# Patient Record
Sex: Male | Born: 1955
Health system: Southern US, Community
[De-identification: ages and names within clinical notes are randomized; demographics above are authoritative.]

## PROBLEM LIST (undated history)

## (undated) DIAGNOSIS — N189 Chronic kidney disease, unspecified: Secondary | ICD-10-CM

## (undated) HISTORY — PX: BACK SURGERY: SHX140

---

## 1999-06-07 ENCOUNTER — Emergency Department (HOSPITAL_COMMUNITY): Admission: EM | Admit: 1999-06-07 | Discharge: 1999-06-07 | Payer: Self-pay | Admitting: *Deleted

## 2007-04-28 ENCOUNTER — Ambulatory Visit: Payer: Self-pay | Admitting: Sports Medicine

## 2007-04-28 DIAGNOSIS — M533 Sacrococcygeal disorders, not elsewhere classified: Secondary | ICD-10-CM | POA: Insufficient documentation

## 2007-04-29 ENCOUNTER — Encounter: Payer: Self-pay | Admitting: Sports Medicine

## 2013-04-06 ENCOUNTER — Ambulatory Visit
Admission: RE | Admit: 2013-04-06 | Discharge: 2013-04-06 | Disposition: A | Discharge status: Home / Self Care | Payer: BC Managed Care – PPO | Source: Ambulatory Visit | Attending: Sports Medicine | Admitting: Sports Medicine

## 2013-04-06 ENCOUNTER — Encounter: Payer: Self-pay | Admitting: Sports Medicine

## 2013-04-06 ENCOUNTER — Ambulatory Visit (INDEPENDENT_AMBULATORY_CARE_PROVIDER_SITE_OTHER): Payer: BC Managed Care – PPO | Admitting: Sports Medicine

## 2013-04-06 VITALS — BP 104/72 | Ht 70.0 in | Wt 180.0 lb

## 2013-04-06 DIAGNOSIS — M545 Low back pain, unspecified: Secondary | ICD-10-CM

## 2013-04-06 DIAGNOSIS — M533 Sacrococcygeal disorders, not elsewhere classified: Secondary | ICD-10-CM

## 2013-04-06 NOTE — Assessment & Plan Note (Signed)
WE will start a core exercise series that has different elements  Keep up other exercises that work  Can consider CSI or MM relaxant if patient wishes  Reck 6 to 8 wks

## 2013-04-06 NOTE — Progress Notes (Signed)
Patient ID: Chris Howard, male   DOB: [birth year], 58 y.o.   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

## 2015-09-20 DIAGNOSIS — H903 Sensorineural hearing loss, bilateral: Secondary | ICD-10-CM | POA: Diagnosis not present

## 2015-09-24 DIAGNOSIS — M7701 Medial epicondylitis, right elbow: Secondary | ICD-10-CM | POA: Diagnosis not present

## 2016-04-22 ENCOUNTER — Encounter: Payer: Self-pay | Admitting: Family Medicine

## 2016-04-22 ENCOUNTER — Encounter (INDEPENDENT_AMBULATORY_CARE_PROVIDER_SITE_OTHER): Payer: Self-pay

## 2016-04-22 ENCOUNTER — Ambulatory Visit (INDEPENDENT_AMBULATORY_CARE_PROVIDER_SITE_OTHER): Payer: BLUE CROSS/BLUE SHIELD | Admitting: Family Medicine

## 2016-04-22 DIAGNOSIS — S8990XA Unspecified injury of unspecified lower leg, initial encounter: Secondary | ICD-10-CM

## 2016-04-22 NOTE — Patient Instructions (Signed)
You have a Grade 2 calf strain (partial tear of your medial gastroc). Compression sleeve, ACE wrap, or compression sock (20-56mm Hg pressure) during the day and when up and walking around to help with swelling. Ice or heat, whichever feels better at this point 15 minutes at a time 3-4 times a day. Heel lifts typically help unload the area (or shoes with a natural heel lift). Tylenol 500 mg 1-2 tabs three times a day as needed for pain. Typically avoid ibuprofen, aleve as these increase the risk of bleeding. Start ankle range of motion exercises (up/down and alphabet exercises). Over next 1-2 weeks add the theraband strengthening exercise with yellow band. Cycling with low resistance or swimming for cross training in meantime. I wouldn't run until I see you back in the office in 2-3 weeks and we reevaluate your status.

## 2016-04-24 DIAGNOSIS — S8990XA Unspecified injury of unspecified lower leg, initial encounter: Secondary | ICD-10-CM | POA: Insufficient documentation

## 2016-04-24 NOTE — Assessment & Plan Note (Signed)
consistent with Grade 2 strain.  Compression, icing/heat, elevation.  Heel lifts.  Tylenol if needed.  Start motion exercises - add strengthening with theraband over 1-2 weeks as pain improves.  No running - cycling or swimming ok if minimal pain.  F/u in 2-3 weeks.

## 2016-04-24 NOTE — Progress Notes (Signed)
PCP: No PCP Per Patient  Subjective:   HPI: Patient is a 61 y.o. male here for left calf injury.  Patient reports on 4/5 he was lifting something outside weighing about 300 pounds. He states it felt like someone had thrown and hit him with a baseball in back of left calf. + swelling and bruising. Difficulty sleeping due to pain. Iced that night. Pain remains a 4-5/10 level, sharp and stiff. Worse with uphill walking. Bruising the only skin changes. No numbness.  No past medical history on file.  Current Outpatient Prescriptions on File Prior to Visit  Medication Sig Dispense Refill  . levofloxacin (LEVAQUIN) 500 MG tablet      No current facility-administered medications on file prior to visit.     No past surgical history on file.  No Known Allergies  Social History   Social History  . Marital status: Married    Spouse name: N/A  . Number of children: N/A  . Years of education: N/A   Occupational History  . Not on file.   Social History Main Topics  . Smoking status: Never Smoker  . Smokeless tobacco: Never Used  . Alcohol use Not on file  . Drug use: Unknown  . Sexual activity: Not on file   Other Topics Concern  . Not on file   Social History Narrative  . No narrative on file    No family history on file.  BP 118/70   Pulse (!) 59   Ht 5\' 11"  (1.803 m)   Wt 185 lb (83.9 kg)   BMI 25.80 kg/m   Review of Systems: See HPI above.     Objective:  Physical Exam:  Gen: NAD, comfortable in exam room  Left leg: Bruising, mod swelling lower leg.  No other deformity. TTP medial and mid calf.  No achilles, proximal gastroc tendon, other tenderness. Unable to do calf raise.  Pain on plantarflexion and full dorsiflexion of ankle. FROM knee. NVI distally.  MSK u/s left calf:  Venous structures easily compressible.  Layer of anechoic fluid surrounding medial gastroc.  Some disorganization proximally but no obvious tear visualized.  Achilles and  proximal gastroc tendons intact.   Assessment & Plan:  1. Left calf strain - consistent with Grade 2 strain.  Compression, icing/heat, elevation.  Heel lifts.  Tylenol if needed.  Start motion exercises - add strengthening with theraband over 1-2 weeks as pain improves.  No running - cycling or swimming ok if minimal pain.  F/u in 2-3 weeks.

## 2016-05-13 ENCOUNTER — Ambulatory Visit: Payer: BLUE CROSS/BLUE SHIELD | Admitting: Family Medicine

## 2016-06-04 ENCOUNTER — Encounter: Payer: Self-pay | Admitting: Family Medicine

## 2016-06-04 ENCOUNTER — Ambulatory Visit (INDEPENDENT_AMBULATORY_CARE_PROVIDER_SITE_OTHER): Payer: BLUE CROSS/BLUE SHIELD | Admitting: Family Medicine

## 2016-06-04 DIAGNOSIS — M533 Sacrococcygeal disorders, not elsewhere classified: Secondary | ICD-10-CM

## 2016-06-04 NOTE — Patient Instructions (Signed)
You have sacroiliitis (SI joint inflammation). Continue the stretches - hold for 20-30 seconds, repeat each 3 times. You can do this a couple times a day and as needed. Aleve 2 tabs twice a day with food for pain and inflammation - I would recommend taking for 7-10 days then as needed. Consider diclofenac or prednisone dose pack if not improving. Other considerations: physical therapy, chiropractic care. Let me know how you're doing in 1-2 weeks.

## 2016-06-05 NOTE — Assessment & Plan Note (Signed)
2/2 SI joint dysfunction/sacroiliitis.  He would like to start with aleve twice a day and continue his home exercises and stretches..  He will consider physical therapy, chiropractic care, diclofenac, prednisone depending how he's doing over next 1-2 weeks.

## 2016-06-05 NOTE — Progress Notes (Signed)
PCP: Patient, No Pcp Per  Subjective:   HPI: Patient is a 61 y.o. male here for low back pain.  Patient reports having prior history of SI joint problems that resolved with modification (not wearing belt with running) and home exercises/stretches. Past 10 days pain has recurred only on left side without new injury. Worse with sleeping - waking him up every hour. Doing home exercises and stretches which help when this flares up. Better during the day. No radiation into leg. No numbness or skin changes. Pain level 3/10 at rest, up to 7/10 and sharp at worst. No bowel/bladder dysfunction.  No past medical history on file.  Current Outpatient Prescriptions on File Prior to Visit  Medication Sig Dispense Refill  . levofloxacin (LEVAQUIN) 500 MG tablet      No current facility-administered medications on file prior to visit.     No past surgical history on file.  No Known Allergies  Social History   Social History  . Marital status: Married    Spouse name: N/A  . Number of children: N/A  . Years of education: N/A   Occupational History  . Not on file.   Social History Main Topics  . Smoking status: Never Smoker  . Smokeless tobacco: Never Used  . Alcohol use Not on file  . Drug use: Unknown  . Sexual activity: Not on file   Other Topics Concern  . Not on file   Social History Narrative  . No narrative on file    No family history on file.  BP 125/76   Pulse (!) 55   Ht 5\' 10"  (1.778 m)   Wt 190 lb (86.2 kg)   BMI 27.26 kg/m   Review of Systems: See HPI above.     Objective:  Physical Exam:  Gen: NAD, comfortable in exam room  Back: No gross deformity, scoliosis. TTP left SI joint.  No midline or bony TTP. FROM. Strength LEs 5/5 all muscle groups.   2+ MSRs in patellar and achilles tendons, equal bilaterally. Negative SLRs. Sensation intact to light touch bilaterally. Negative logroll bilateral hips Positive fabers on left with decreased  motion.  Negative right fabers but also stiff here.  Negative piriformis.   Assessment & Plan:  1. Low back pain - 2/2 SI joint dysfunction/sacroiliitis.  He would like to start with aleve twice a day and continue his home exercises and stretches..  He will consider physical therapy, chiropractic care, diclofenac, prednisone depending how he's doing over next 1-2 weeks.

## 2016-06-06 ENCOUNTER — Telehealth: Payer: Self-pay | Admitting: Family Medicine

## 2016-06-06 MED ORDER — DICLOFENAC SODIUM 75 MG PO TBEC: 75.0000 mg | DELAYED_RELEASE_TABLET | Freq: Two times a day (BID) | ORAL | 1 refills | Status: DC

## 2016-06-06 NOTE — Telephone Encounter (Signed)
Ok - not unusual for this to radiate down the leg.  Sent the diclofenac in.  If he's still struggling next week let me know.  Thanks!

## 2016-06-06 NOTE — Telephone Encounter (Signed)
Patient states pain has increased. The pain is now moving from the rear of his hip down his leg. Patient initially stated in [place] that he did not need medication but now would like a Rx of Diclofenac to help with his increase of pain   Requesting medication to be sent to [facility] on [address]

## 2016-06-06 NOTE — Telephone Encounter (Signed)
Spoke to patient to let him know that the prescription had been sent to his pharmacy and he stated that he has already picked it up.

## 2016-06-07 ENCOUNTER — Telehealth: Payer: Self-pay | Admitting: Family Medicine

## 2016-06-07 NOTE — Telephone Encounter (Signed)
Patient states his pain is increasing. He is only able to sleep an hour or so a night due to the pain. States the pain has now spread further down his legs. The pain was only at his hip and now has moved down to his knee.   Patient took diclofenac yesterday, one at 11 am and one at night and states there was no relief. Wants to know of additional medication he can take with the diclofenac  States he used to get pain relief from certain exercises and now those exercises are not working.   Pain has gone from a 3/4 to a 9. Patient seeking immediate help. Informed patient provider would not be in the office until Monday. If seeking immediate care, to go to [facility] or ED.

## 2016-06-10 ENCOUNTER — Encounter: Payer: Self-pay | Admitting: Family Medicine

## 2016-06-10 ENCOUNTER — Ambulatory Visit (HOSPITAL_BASED_OUTPATIENT_CLINIC_OR_DEPARTMENT_OTHER)
Admission: RE | Admit: 2016-06-10 | Discharge: 2016-06-10 | Disposition: A | Discharge status: Home / Self Care | Payer: BLUE CROSS/BLUE SHIELD | Source: Ambulatory Visit | Attending: Family Medicine | Admitting: Family Medicine

## 2016-06-10 ENCOUNTER — Ambulatory Visit (INDEPENDENT_AMBULATORY_CARE_PROVIDER_SITE_OTHER): Payer: BLUE CROSS/BLUE SHIELD | Admitting: Family Medicine

## 2016-06-10 VITALS — BP 119/78 | HR 125 | Ht 70.0 in | Wt 190.0 lb

## 2016-06-10 DIAGNOSIS — M5126 Other intervertebral disc displacement, lumbar region: Secondary | ICD-10-CM | POA: Diagnosis not present

## 2016-06-10 DIAGNOSIS — I700 Atherosclerosis of aorta: Secondary | ICD-10-CM | POA: Insufficient documentation

## 2016-06-10 DIAGNOSIS — M545 Low back pain, unspecified: Secondary | ICD-10-CM

## 2016-06-10 DIAGNOSIS — M79605 Pain in left leg: Secondary | ICD-10-CM

## 2016-06-10 DIAGNOSIS — M2578 Osteophyte, vertebrae: Secondary | ICD-10-CM | POA: Diagnosis not present

## 2016-06-10 MED ORDER — HYDROCODONE-ACETAMINOPHEN 7.5-325 MG PO TABS: 1.0000 | ORAL_TABLET | Freq: Four times a day (QID) | ORAL | 0 refills | Status: DC | PRN

## 2016-06-10 MED FILL — HYDROCODON-APAP 7.5-325: 7.5-325 | 5 days supply | Qty: 20 | Fill #0

## 2016-06-10 NOTE — Telephone Encounter (Signed)
Spoke to patient and gave him appointment date and time for MRI. Order has been faxed.

## 2016-06-10 NOTE — Assessment & Plan Note (Signed)
Patient with progressive pain and now radiation into left leg with decreased patellar reflex.  Not improving with diclofenac.  Discussed prednisone and MRI - he would like to do MRI as next step, hold prednisone for now.  Norco as needed for severe pain.  Will call with results and next steps.

## 2016-06-10 NOTE — Progress Notes (Addendum)
PCP: Patient, No Pcp Per  Subjective:   HPI: Patient is a 61 y.o. male here for low back pain.  5/29: Patient reports having prior history of SI joint problems that resolved with modification (not wearing belt with running) and home exercises/stretches. Past 10 days pain has recurred only on left side without new injury. Worse with sleeping - waking him up every hour. Doing home exercises and stretches which help when this flares up. Better during the day. No radiation into leg. No numbness or skin changes. Pain level 3/10 at rest, up to 7/10 and sharp at worst. No bowel/bladder dysfunction.  6/4: Patient returns with persistent low back pain. Pain is sharp at 6-7/10 level left side of low back. Radiating down left leg now into thigh with cramping and into shin with numbness below the knee. Stretches/exercises not helping. Diclofenac has also not helped him. No bowel/bladder dysfunction.  No past medical history on file.  Current Outpatient Prescriptions on File Prior to Visit  Medication Sig Dispense Refill  . diclofenac (VOLTAREN) 75 MG EC tablet Take 1 tablet (75 mg total) by mouth 2 (two) times daily. 60 tablet 1  . levofloxacin (LEVAQUIN) 500 MG tablet      No current facility-administered medications on file prior to visit.     No past surgical history on file.  No Known Allergies  Social History   Social History  . Marital status: Married    Spouse name: N/A  . Number of children: N/A  . Years of education: N/A   Occupational History  . Not on file.   Social History Main Topics  . Smoking status: Never Smoker  . Smokeless tobacco: Never Used  . Alcohol use Not on file  . Drug use: Unknown  . Sexual activity: Not on file   Other Topics Concern  . Not on file   Social History Narrative  . No narrative on file    No family history on file.  BP 119/78   Pulse (!) 125   Ht 5\' 10"  (1.778 m)   Wt 190 lb (86.2 kg)   BMI 27.26 kg/m   Review of  Systems: See HPI above.     Objective:  Physical Exam:  Gen: NAD, comfortable in exam room  Back: No gross deformity, scoliosis. TTP left lumbar paraspinal region.  No midline or bony TTP. FROM. Strength LEs 5-/5 with hip flexion, knee extension.  5/5 all other muscle groups.   Trace left patellar, 2+ right patellar and bilateral achilles tendon MSRs. Negative SLRs. Sensation intact to light touch bilaterally. Negative logroll bilateral hips   Assessment & Plan:  1. Low back pain - Patient with progressive pain and now radiation into left leg with decreased patellar reflex.  Not improving with diclofenac.  Discussed prednisone and MRI - he would like to do MRI as next step, hold prednisone for now.  Norco as needed for severe pain.  Will call with results and next steps.  Addendum:  MRI reviewed and discussed with patient.  He has a disc extrusion at L3-4 on left side also with a sequestered disc fragment here leading to radiculopathy.  We discussed options - he would like to try prednisone dose pack and call us in a week to let us know how he's doing.  Consider ESI here or physical therapy depending on his improvement.

## 2016-06-10 NOTE — Patient Instructions (Signed)
We will go ahead with an MRI of your lumbar spine asap. This is consistent with either an L4 or L5 radiculopathy based on distribution, decreased reflex. Take norco as needed for severe pain at nighttime and when you return home from work. Consider prednisone dose pack, injection, neurosurgery referral, physical therapy depending on results.

## 2016-06-10 NOTE — Telephone Encounter (Signed)
The concern then is that he's getting referred pain more from a lumbar nerve root than from the SI joint despite his exam.  I think he should try prednisone dose pack, think about imaging of lumbar spine if this doesn't work after a couple days.

## 2016-06-11 MED ORDER — PREDNISONE 10 MG PO TABS: ORAL_TABLET | ORAL | 0 refills | Status: DC

## 2016-06-11 NOTE — Addendum Note (Signed)
Addended by: Karlton Lemon R on: 06/11/2016 05:00 PM   Modules accepted: Orders

## 2016-06-19 ENCOUNTER — Telehealth: Payer: Self-pay | Admitting: Family Medicine

## 2016-06-19 NOTE — Telephone Encounter (Signed)
That's frustrating but a good sign that it helped him initially.  I'd go ahead with an ESI on left side at L3-4 if he's amenable to this.  Other options would be physical therapy, neurosurgery referral - typically want more benefit before starting therapy though.

## 2016-06-19 NOTE — Telephone Encounter (Signed)
Patient has finished prednisone. Calling to update provider on how he is doing. Requesting a call back

## 2016-06-19 NOTE — Telephone Encounter (Signed)
Spoke to patient and he stated that he took the prednisone and felt better first couple of days with 75% of the pain gone. Pain started back on 06-11 and is in the left hip and the left leg. The pain is constant and is having numbness from the thigh to shin. Pain is 7/10.

## 2016-06-21 NOTE — Telephone Encounter (Signed)
Had contacted patient and gave him information provided by the physician.

## 2016-06-27 ENCOUNTER — Ambulatory Visit (INDEPENDENT_AMBULATORY_CARE_PROVIDER_SITE_OTHER): Payer: BLUE CROSS/BLUE SHIELD | Admitting: Sports Medicine

## 2016-06-27 ENCOUNTER — Encounter: Payer: Self-pay | Admitting: Sports Medicine

## 2016-06-27 ENCOUNTER — Ambulatory Visit: Payer: BLUE CROSS/BLUE SHIELD | Admitting: Sports Medicine

## 2016-06-27 DIAGNOSIS — M545 Low back pain, unspecified: Secondary | ICD-10-CM

## 2016-06-27 DIAGNOSIS — M79605 Pain in left leg: Secondary | ICD-10-CM

## 2016-06-27 DIAGNOSIS — M5126 Other intervertebral disc displacement, lumbar region: Secondary | ICD-10-CM | POA: Diagnosis not present

## 2016-06-27 MED ORDER — PREDNISONE 20 MG PO TABS: 20.0000 mg | ORAL_TABLET | Freq: Two times a day (BID) | ORAL | 0 refills | Status: DC

## 2016-06-27 NOTE — Assessment & Plan Note (Signed)
Will restart another 7 days of prednisone at 20 Bid  Refer to neurosurgery for their opinion about intervention

## 2016-06-27 NOTE — Progress Notes (Signed)
   Subjective:    Chris Howard - 61 y.o. male MRN [id number]  Date of birth: 12/07/1955  CC: Low back pain  HPI: Chris Howard is a 61 y/o male presenting with left low back pain. He has a hx of SI joint pain that has been relieved in the past with stretching exercises. He recently saw Dr. Barbaraann Barthel in [facility] and was treated with prednisone which improved his pain at the beginning but his pain returned as the tapered dose wore off. His pain is sharp and localized over the left SI joint. Pain has been present for the past month and it has worsened since then. Pain is worse with activity and wakes him from sleep as well.   Endorses weakness in his left leg and he fell twice due to the weakness. This is a new finding.  Endorses radiation of pain down his left leg. Endorses numbness on the anterior aspect of his left tibia.   ROS: Denies swelling. Denies pain while sneezing or coughing. Denies bowel or bladder dysfxn. Negative except per HPI.  PMH: SI joint pain  Surgical Hx: None Social Hx: Runs about 5 to 7 miles per day but unable to do so for the past month.  MRI L Spine: Shows disc extrusion at L3-L4 on left side also with a sequestered disc fragment leading to radiculopathy.  Objective:   Physical Exam BP 108/70   Ht 5\' 10"  (1.778 m)   Wt 190 lb (86.2 kg)   BMI 27.26 kg/m  Gen: NAD, alert, cooperative with exam, well-appearing Skin: no rashes, normal turgor  MSK: Back inspection unremarkable. TTP over left SI joint. FROM of back upon flexion and extension but painful. FROM in knee and hip. R. Leg strength: Quad: 5/5 Hamstring: 5/5 Hip flexor: 5/5 Hip abductors: 5/5  L. Leg strength: Quad: 3/5 Hamstring: 3/5 Hip flexor: 3/5 Hip abductors: 3/5  SLR laying: Negative  XSLR laying: Negative  Sensory: R leg sensation grossly intact; L leg sensation diminished to light touch over the proximal tibia. Reflexes: Diminished patellar on the left; +2 patellar reflex on the right      Assessment & Plan:  Chris Howard is a 61 y/o male presenting with left low back pain.  L3-L4 radiculopathy His MRI shows disc extrusion at L3-L4. He has significant weakness of his left leg which is worrisome. I would like for him to get evaluated by a spine surgeon for possible microdiscectomy or other possible intervention. Since he has had some relief by using prednisone, I will start him on prednisone 20mg  until he gets evaluated by a spine specialist. -Referral for a spine specialist -20mg  prednisone BID for 7 days

## 2016-06-27 NOTE — Assessment & Plan Note (Signed)
This persists Improved with prednisone  Does not like pain meds and basically using some advil  Likely from the L3/4 disc

## 2016-07-01 ENCOUNTER — Encounter: Payer: Self-pay | Admitting: Family Medicine

## 2016-07-18 ENCOUNTER — Other Ambulatory Visit: Payer: Self-pay | Admitting: *Deleted

## 2016-07-18 NOTE — Progress Notes (Signed)
Dr Sherwood Gambler 07/30/16 at [facility]  [place] [address]

## 2016-07-24 DIAGNOSIS — M5126 Other intervertebral disc displacement, lumbar region: Secondary | ICD-10-CM | POA: Diagnosis not present

## 2016-07-24 DIAGNOSIS — M549 Dorsalgia, unspecified: Secondary | ICD-10-CM | POA: Diagnosis not present

## 2016-07-24 DIAGNOSIS — M5416 Radiculopathy, lumbar region: Secondary | ICD-10-CM | POA: Diagnosis not present

## 2016-07-24 DIAGNOSIS — M545 Low back pain: Secondary | ICD-10-CM | POA: Diagnosis not present

## 2016-07-26 DIAGNOSIS — M5126 Other intervertebral disc displacement, lumbar region: Secondary | ICD-10-CM | POA: Diagnosis not present

## 2016-07-26 DIAGNOSIS — Z4689 Encounter for fitting and adjustment of other specified devices: Secondary | ICD-10-CM | POA: Diagnosis not present

## 2016-07-26 DIAGNOSIS — M545 Low back pain: Secondary | ICD-10-CM | POA: Diagnosis not present

## 2016-07-26 DIAGNOSIS — M5416 Radiculopathy, lumbar region: Secondary | ICD-10-CM | POA: Diagnosis not present

## 2016-07-30 DIAGNOSIS — M5416 Radiculopathy, lumbar region: Secondary | ICD-10-CM | POA: Diagnosis not present

## 2016-07-30 DIAGNOSIS — Z01818 Encounter for other preprocedural examination: Secondary | ICD-10-CM | POA: Diagnosis not present

## 2016-08-05 DIAGNOSIS — M5106 Intervertebral disc disorders with myelopathy, lumbar region: Secondary | ICD-10-CM | POA: Diagnosis not present

## 2016-08-05 DIAGNOSIS — M48061 Spinal stenosis, lumbar region without neurogenic claudication: Secondary | ICD-10-CM | POA: Diagnosis not present

## 2016-08-05 DIAGNOSIS — Z79899 Other long term (current) drug therapy: Secondary | ICD-10-CM | POA: Diagnosis not present

## 2016-08-05 DIAGNOSIS — M5416 Radiculopathy, lumbar region: Secondary | ICD-10-CM | POA: Diagnosis not present

## 2016-08-05 DIAGNOSIS — M5116 Intervertebral disc disorders with radiculopathy, lumbar region: Secondary | ICD-10-CM | POA: Diagnosis not present

## 2016-08-05 DIAGNOSIS — M4326 Fusion of spine, lumbar region: Secondary | ICD-10-CM | POA: Diagnosis not present

## 2016-08-05 DIAGNOSIS — M545 Low back pain: Secondary | ICD-10-CM | POA: Diagnosis not present

## 2016-08-05 DIAGNOSIS — Z87891 Personal history of nicotine dependence: Secondary | ICD-10-CM | POA: Diagnosis not present

## 2016-08-05 DIAGNOSIS — M961 Postlaminectomy syndrome, not elsewhere classified: Secondary | ICD-10-CM | POA: Diagnosis not present

## 2016-08-05 DIAGNOSIS — M5126 Other intervertebral disc displacement, lumbar region: Secondary | ICD-10-CM | POA: Diagnosis not present

## 2016-08-05 DIAGNOSIS — M25552 Pain in left hip: Secondary | ICD-10-CM | POA: Diagnosis not present

## 2017-02-20 DIAGNOSIS — M545 Low back pain: Secondary | ICD-10-CM | POA: Diagnosis not present

## 2017-02-20 DIAGNOSIS — M5136 Other intervertebral disc degeneration, lumbar region: Secondary | ICD-10-CM | POA: Diagnosis not present

## 2018-01-10 DIAGNOSIS — J22 Unspecified acute lower respiratory infection: Secondary | ICD-10-CM | POA: Diagnosis not present

## 2018-01-10 DIAGNOSIS — R05 Cough: Secondary | ICD-10-CM | POA: Diagnosis not present

## 2018-01-10 DIAGNOSIS — R062 Wheezing: Secondary | ICD-10-CM | POA: Diagnosis not present

## 2018-10-14 DIAGNOSIS — L819 Disorder of pigmentation, unspecified: Secondary | ICD-10-CM | POA: Diagnosis not present

## 2018-10-14 DIAGNOSIS — L821 Other seborrheic keratosis: Secondary | ICD-10-CM | POA: Diagnosis not present

## 2019-06-04 DIAGNOSIS — L821 Other seborrheic keratosis: Secondary | ICD-10-CM | POA: Diagnosis not present

## 2019-06-04 DIAGNOSIS — B351 Tinea unguium: Secondary | ICD-10-CM | POA: Diagnosis not present

## 2019-06-04 DIAGNOSIS — B360 Pityriasis versicolor: Secondary | ICD-10-CM | POA: Diagnosis not present

## 2019-06-04 DIAGNOSIS — B353 Tinea pedis: Secondary | ICD-10-CM | POA: Diagnosis not present

## 2019-12-23 DIAGNOSIS — L308 Other specified dermatitis: Secondary | ICD-10-CM | POA: Diagnosis not present

## 2019-12-23 DIAGNOSIS — L562 Photocontact dermatitis [berloque dermatitis]: Secondary | ICD-10-CM | POA: Diagnosis not present

## 2019-12-23 DIAGNOSIS — L986 Other infiltrative disorders of the skin and subcutaneous tissue: Secondary | ICD-10-CM | POA: Diagnosis not present

## 2020-01-11 DIAGNOSIS — Z20828 Contact with and (suspected) exposure to other viral communicable diseases: Secondary | ICD-10-CM | POA: Diagnosis not present

## 2020-02-16 DIAGNOSIS — J301 Allergic rhinitis due to pollen: Secondary | ICD-10-CM | POA: Diagnosis not present

## 2020-02-16 DIAGNOSIS — R21 Rash and other nonspecific skin eruption: Secondary | ICD-10-CM | POA: Diagnosis not present

## 2020-02-16 DIAGNOSIS — J3081 Allergic rhinitis due to animal (cat) (dog) hair and dander: Secondary | ICD-10-CM | POA: Diagnosis not present

## 2020-02-16 DIAGNOSIS — J3089 Other allergic rhinitis: Secondary | ICD-10-CM | POA: Diagnosis not present

## 2020-03-02 DIAGNOSIS — R21 Rash and other nonspecific skin eruption: Secondary | ICD-10-CM | POA: Diagnosis not present

## 2020-03-08 DIAGNOSIS — L309 Dermatitis, unspecified: Secondary | ICD-10-CM | POA: Diagnosis not present

## 2020-03-27 DIAGNOSIS — J301 Allergic rhinitis due to pollen: Secondary | ICD-10-CM | POA: Diagnosis not present

## 2020-03-27 DIAGNOSIS — J3089 Other allergic rhinitis: Secondary | ICD-10-CM | POA: Diagnosis not present

## 2020-03-27 DIAGNOSIS — R21 Rash and other nonspecific skin eruption: Secondary | ICD-10-CM | POA: Diagnosis not present

## 2020-03-27 DIAGNOSIS — J3081 Allergic rhinitis due to animal (cat) (dog) hair and dander: Secondary | ICD-10-CM | POA: Diagnosis not present

## 2020-03-29 DIAGNOSIS — J3089 Other allergic rhinitis: Secondary | ICD-10-CM | POA: Diagnosis not present

## 2020-03-29 DIAGNOSIS — R21 Rash and other nonspecific skin eruption: Secondary | ICD-10-CM | POA: Diagnosis not present

## 2020-03-29 DIAGNOSIS — J301 Allergic rhinitis due to pollen: Secondary | ICD-10-CM | POA: Diagnosis not present

## 2020-03-29 DIAGNOSIS — L230 Allergic contact dermatitis due to metals: Secondary | ICD-10-CM | POA: Diagnosis not present

## 2020-03-31 DIAGNOSIS — J3081 Allergic rhinitis due to animal (cat) (dog) hair and dander: Secondary | ICD-10-CM | POA: Diagnosis not present

## 2020-03-31 DIAGNOSIS — J3089 Other allergic rhinitis: Secondary | ICD-10-CM | POA: Diagnosis not present

## 2020-03-31 DIAGNOSIS — J301 Allergic rhinitis due to pollen: Secondary | ICD-10-CM | POA: Diagnosis not present

## 2020-03-31 DIAGNOSIS — R21 Rash and other nonspecific skin eruption: Secondary | ICD-10-CM | POA: Diagnosis not present

## 2020-04-11 DIAGNOSIS — L821 Other seborrheic keratosis: Secondary | ICD-10-CM | POA: Diagnosis not present

## 2020-04-11 DIAGNOSIS — L309 Dermatitis, unspecified: Secondary | ICD-10-CM | POA: Diagnosis not present

## 2020-05-24 DIAGNOSIS — Z23 Encounter for immunization: Secondary | ICD-10-CM | POA: Diagnosis not present

## 2020-05-24 DIAGNOSIS — Z13220 Encounter for screening for lipoid disorders: Secondary | ICD-10-CM | POA: Diagnosis not present

## 2020-05-24 DIAGNOSIS — Z0000 Encounter for general adult medical examination without abnormal findings: Secondary | ICD-10-CM | POA: Diagnosis not present

## 2020-06-27 DIAGNOSIS — R079 Chest pain, unspecified: Secondary | ICD-10-CM | POA: Diagnosis not present

## 2020-08-23 DIAGNOSIS — R109 Unspecified abdominal pain: Secondary | ICD-10-CM | POA: Diagnosis not present

## 2020-08-23 DIAGNOSIS — R079 Chest pain, unspecified: Secondary | ICD-10-CM | POA: Diagnosis not present

## 2020-08-24 ENCOUNTER — Ambulatory Visit
Admission: RE | Admit: 2020-08-24 | Discharge: 2020-08-24 | Disposition: A | Discharge status: Home / Self Care | Payer: BLUE CROSS/BLUE SHIELD | Source: Ambulatory Visit | Attending: Family Medicine | Admitting: Family Medicine

## 2020-08-24 ENCOUNTER — Other Ambulatory Visit: Payer: Self-pay | Admitting: Family Medicine

## 2020-08-24 ENCOUNTER — Ambulatory Visit
Admission: RE | Admit: 2020-08-24 | Discharge: 2020-08-24 | Disposition: A | Discharge status: Home / Self Care | Payer: BC Managed Care – PPO | Source: Ambulatory Visit | Attending: Family Medicine | Admitting: Family Medicine

## 2020-08-24 DIAGNOSIS — R7989 Other specified abnormal findings of blood chemistry: Secondary | ICD-10-CM

## 2020-08-24 DIAGNOSIS — R079 Chest pain, unspecified: Secondary | ICD-10-CM

## 2020-08-24 DIAGNOSIS — R071 Chest pain on breathing: Secondary | ICD-10-CM

## 2020-08-24 MED ORDER — IOPAMIDOL (ISOVUE-370) INJECTION 76%
75.0000 mL | Freq: Once | INTRAVENOUS | Status: AC | PRN
  Administered 2020-08-24: 75 mL via INTRAVENOUS

## 2020-08-25 ENCOUNTER — Telehealth: Payer: Self-pay | Admitting: Hematology and Oncology

## 2020-08-25 NOTE — Telephone Encounter (Signed)
Received a new hem referral from Chris Howard for hx of dvt. Mr. Chris Howard has been cld and scheduled to see Dr. Lindi Adie on 9/6 at 3pm. Pt aware to arrive 15 minutes early.

## 2020-08-29 ENCOUNTER — Other Ambulatory Visit: Payer: Self-pay | Admitting: Family Medicine

## 2020-08-29 DIAGNOSIS — R93429 Abnormal radiologic findings on diagnostic imaging of unspecified kidney: Secondary | ICD-10-CM

## 2020-09-10 NOTE — Progress Notes (Signed)
[facility] CONSULT NOTE  Patient Care Team: Pa, [place] as PCP - General (Family Medicine)  CHIEF COMPLAINTS/PURPOSE OF CONSULTATION:  Chronic pulmonary embolus  HISTORY OF PRESENTING ILLNESS:  Chris Howard 65 y.o. male is here because of recent history of pulmonary embolism.  Patient tells me that over the past 6 months or so he has had intermittent chest pains which were initially treated as inflammation of the cartilage with supportive care until the chest pain intermittently got worse.  He was given muscle relaxant which appeared to help with the chest pain.  But more recently started having right flank pain and this led to a CT of the chest showing extensive thrombosis of the pulmonary arteries in the segmental and subsegmental areas along with hilar lymphadenopathy and a soft tissue attenuation of unclear origin.  He was started on Xarelto and appears to be tolerating it well.  The CT also identified a renal mass with a cystic lesion for which he is undergoing abdominal MRI  US Venous Img Lower Bilateral on 08/24/2020 showed no femoropopliteal DVT nor evidence of DVT within the visualized calf veins. He presents to the clinic today for initial evaluation and discussion of treatment options.   I reviewed his records extensively and collaborated the history with the patient.  MEDICAL HISTORY:  Vitiligo Skin rash  SURGICAL HISTORY: No prior surgeries  SOCIAL HISTORY: Denies any tobacco or alcohol or recreational drug use FAMILY HISTORY: Extensive family history of blood clots   ALLERGIES:  has No Known Allergies.  MEDICATIONS:  Current Outpatient Medications  Medication Sig Dispense Refill   rivaroxaban (XARELTO) 20 MG TABS tablet Take 1 tablet (20 mg total) by mouth daily with supper. 30 tablet 11   diclofenac (VOLTAREN) 75 MG EC tablet Take 1 tablet (75 mg total) by mouth 2 (two) times daily. 60 tablet 1   HYDROcodone-acetaminophen (NORCO)  7.5-325 MG tablet Take 1 tablet by mouth every 6 (six) hours as needed for moderate pain. 20 tablet 0   levofloxacin (LEVAQUIN) 500 MG tablet      predniSONE (DELTASONE) 10 MG tablet 6 tabs po day 1, 5 tabs po day 2, 4 tabs po day 3, 3 tabs po day 4, 2 tabs po day 5, 1 tab po day 6 21 tablet 0   predniSONE (DELTASONE) 20 MG tablet Take 1 tablet (20 mg total) by mouth 2 (two) times daily. 14 tablet 0   No current facility-administered medications for this visit.    REVIEW OF SYSTEMS:   Constitutional: Denies fevers, chills or abnormal night sweats Eyes: Denies blurriness of vision, double vision or watery eyes Ears, nose, mouth, throat, and face: Denies mucositis or sore throat Respiratory: Denies cough, dyspnea or wheezes Cardiovascular: Denies palpitation, chest discomfort or lower extremity swelling Gastrointestinal:  Denies nausea, heartburn or change in bowel habits Skin: Denies abnormal skin rashes Lymphatics: Denies new lymphadenopathy or easy bruising Neurological:Denies numbness, tingling or new weaknesses Behavioral/Psych: Mood is stable, no new changes  All other systems were reviewed with the patient and are negative.  PHYSICAL EXAMINATION: ECOG PERFORMANCE STATUS: 1 - Symptomatic but completely ambulatory  Vitals:   09/12/20 1521 09/12/20 1523  BP: (!) 97/50 121/82  Pulse: 60   Resp: 18   Temp: (!) 97.3 F (36.3 C)   SpO2: 98%    Filed Weights   09/12/20 1521  Weight: 190 lb 4.8 oz (86.3 kg)   RADIOGRAPHIC STUDIES: I have personally reviewed the radiological reports and  agreed with the findings in the report.  ASSESSMENT AND PLAN:  Chronic pulmonary embolism (HCC) CT angiogram 08/25/2020: Chronic right pulmonary artery PE with recanalization, no heart strain possible right lung infarct right upper lobe branches, unusual appearance of soft tissue attenuation in the inferior right hilum (felt to be segmental and subsegmental PE with recannulization), right hilar  lymph node 2 cm enhancement behind the 3.4 cm cystic lesion  Plan: 1.  Given his extensive family history, recommend doing hypercoagulability work-up today 2. continue anticoagulation with Xarelto. If no clear-cut etiology is identified then I suspect that he will remain on Xarelto for at least a year. We may consider obtaining a CT chest in 6 months to assess response to Xarelto.  Soft tissue attenuation: Unclear etiology.  We may repeat the CT in 6 months to assess that.  Right kidney lesion: MRI of the abdomen is being performed.  I will send a referral to urology.  I will call him in 1 to 2 weeks with results of hypercoagulability work-up.   All questions were answered. The patient knows to call the clinic with any problems, questions or concerns.   Rulon Eisenmenger, MD, MPH 09/12/2020    I, Thana Ates, am acting as scribe for Nicholas Lose, MD.  I have reviewed the above documentation for accuracy and completeness, and I agree with the above.

## 2020-09-12 ENCOUNTER — Inpatient Hospital Stay: Payer: BC Managed Care – PPO | Attending: Hematology and Oncology | Admitting: Hematology and Oncology

## 2020-09-12 ENCOUNTER — Inpatient Hospital Stay: Payer: BC Managed Care – PPO

## 2020-09-12 ENCOUNTER — Other Ambulatory Visit: Payer: Self-pay

## 2020-09-12 DIAGNOSIS — I2782 Chronic pulmonary embolism: Secondary | ICD-10-CM

## 2020-09-12 DIAGNOSIS — R590 Localized enlarged lymph nodes: Secondary | ICD-10-CM | POA: Diagnosis not present

## 2020-09-12 DIAGNOSIS — Z7901 Long term (current) use of anticoagulants: Secondary | ICD-10-CM | POA: Insufficient documentation

## 2020-09-12 DIAGNOSIS — Z79899 Other long term (current) drug therapy: Secondary | ICD-10-CM | POA: Diagnosis not present

## 2020-09-12 DIAGNOSIS — R079 Chest pain, unspecified: Secondary | ICD-10-CM | POA: Diagnosis not present

## 2020-09-12 DIAGNOSIS — R109 Unspecified abdominal pain: Secondary | ICD-10-CM | POA: Insufficient documentation

## 2020-09-12 DIAGNOSIS — N2889 Other specified disorders of kidney and ureter: Secondary | ICD-10-CM | POA: Insufficient documentation

## 2020-09-12 MED ORDER — RIVAROXABAN 20 MG PO TABS: 20.0000 mg | ORAL_TABLET | Freq: Every day | ORAL | 11 refills | Status: DC

## 2020-09-12 NOTE — Assessment & Plan Note (Addendum)
CT angiogram 08/25/2020: Chronic right pulmonary artery PE with recanalization, no heart strain possible right lung infarct right upper lobe branches, unusual appearance of soft tissue attenuation in the inferior right hilum (felt to be segmental and subsegmental PE with recannulization), right hilar lymph node 2 cm enhancement behind the 3.4 cm cystic lesion  Plan: 1.  Given his extensive family history, recommend doing hypercoagulability work-up today 2. continue anticoagulation with Xarelto. If no clear-cut etiology is identified then I suspect that he will remain on Xarelto for at least a year. We may consider obtaining a CT chest in 6 months to assess response to Xarelto.  Soft tissue attenuation: Unclear etiology.  We may repeat the CT in 6 months to assess that.  Right kidney lesion: MRI of the abdomen is being performed.  I will send a referral to urology.  I will call him in 1 to 2 weeks with results of hypercoagulability work-up.

## 2020-09-13 LAB — PROTEIN S, TOTAL: Protein S Ag, Total: 95 % (ref 60–150)

## 2020-09-13 LAB — DRVVT CONFIRM: dRVVT Confirm: 1.3 ratio — ABNORMAL HIGH (ref 0.8–1.2)

## 2020-09-13 LAB — BETA 2 MICROGLOBULIN, SERUM: Beta-2 Microglobulin: 1.4 mg/L (ref 0.6–2.4)

## 2020-09-13 LAB — ANTITHROMBIN III ANTIGEN: AT III AG PPP IMM-ACNC: 105 % (ref 72–124)

## 2020-09-13 LAB — DRVVT MIX: dRVVT Mix: 48 s — ABNORMAL HIGH (ref 0.0–40.4)

## 2020-09-13 LAB — LUPUS ANTICOAGULANT PANEL
DRVVT: 55.7 s — ABNORMAL HIGH (ref 0.0–47.0)
PTT Lupus Anticoagulant: 31 s (ref 0.0–51.9)

## 2020-09-13 LAB — PROTEIN C, TOTAL: Protein C, Total: 114 % (ref 60–150)

## 2020-09-14 LAB — CARDIOLIPIN ANTIBODIES, IGG, IGM, IGA
Anticardiolipin IgA: <9 APL U/mL (ref 0–11)
Anticardiolipin IgG: <9 GPL U/mL (ref 0–14)
Anticardiolipin IgM: 9 MPL U/mL (ref 0–12)

## 2020-09-18 LAB — PROTHROMBIN GENE MUTATION

## 2020-09-18 LAB — FACTOR 5 LEIDEN

## 2020-09-18 NOTE — Progress Notes (Signed)
  HEMATOLOGY-ONCOLOGY TELEPHONE VISIT PROGRESS NOTE  I connected with Chris Howard on 09/19/2020 at 10:45 AM EDT by telephone and verified that I am speaking with the correct person using two identifiers.  I discussed the limitations, risks, security and privacy concerns of performing an evaluation and management service by telephone and the availability of in person appointments.  I also discussed with the patient that there may be a patient responsible charge related to this service. The patient expressed understanding and agreed to proceed.   History of Present Illness: Chris Howard is a 65 y.o. male with above-mentioned history of chronic pulmonary embolism. He presents via telephone today for follow-up.     Assessment Plan:  Chronic pulmonary embolism ([place]) CT angiogram 08/25/2020: Chronic right pulmonary artery PE with recanalization, no heart strain possible right lung infarct right upper lobe branches, unusual appearance of soft tissue attenuation in the inferior right hilum (felt to be segmental and subsegmental PE with recannulization), right hilar lymph node 2 cm enhancement behind the 3.4 cm cystic lesion  Lab review: 09/12/2020: Lupus anticoagulant positive Factor V Leiden and prothrombin gene mutations: Negative Protein S: 95%, protein C: 114%, anticardiolipin antibodies: Negative, Antithrombin III: 105%  Current treatment: Xarelto He is planning to get Xarelto from [place].  For the next month we will try to send a prescription to [place] so that he may be able to use a voucher to get the first month free.    I discussed the assessment and treatment plan with the patient. The patient was provided an opportunity to ask questions and all were answered. The patient agreed with the plan and demonstrated an understanding of the instructions. The patient was advised to call back or seek an in-person evaluation if the symptoms worsen or if the condition fails to improve as  anticipated.   Total time spent: 12 mins including non-face to face time and time spent for planning, charting and coordination of care  Rulon Eisenmenger, MD 09/19/2020    I, Thana Ates, am acting as scribe for Nicholas Lose, MD.  I have reviewed the above documentation for accuracy and completeness, and I agree with the above.

## 2020-09-19 ENCOUNTER — Telehealth: Payer: Self-pay | Admitting: *Deleted

## 2020-09-19 ENCOUNTER — Other Ambulatory Visit (HOSPITAL_COMMUNITY): Payer: Self-pay

## 2020-09-19 ENCOUNTER — Inpatient Hospital Stay (HOSPITAL_BASED_OUTPATIENT_CLINIC_OR_DEPARTMENT_OTHER): Payer: BC Managed Care – PPO | Admitting: Hematology and Oncology

## 2020-09-19 DIAGNOSIS — I2782 Chronic pulmonary embolism: Secondary | ICD-10-CM | POA: Diagnosis not present

## 2020-09-19 MED ORDER — RIVAROXABAN 20 MG PO TABS
20.0000 mg | ORAL_TABLET | Freq: Every day | ORAL | 0 refills | Status: DC
  Filled 2020-09-19: qty 30, 30d supply, fill #0

## 2020-09-19 NOTE — Assessment & Plan Note (Signed)
CT angiogram 08/25/2020: Chronic right pulmonary artery PE with recanalization, no heart strain possible right lung infarct right upper lobe branches, unusual appearance of soft tissue attenuation in the inferior right hilum (felt to be segmental and subsegmental PE with recannulization), right hilar lymph node 2 cm enhancement behind the 3.4 cm cystic lesion  Lab review: 09/12/2020: Lupus anticoagulant positive Factor V Leiden and prothrombin gene mutations: Negative Protein S: 95%, protein C: 114%, anticardiolipin antibodies: Negative, Antithrombin III: 105%  I explained to the patient in detail the significance of lupus anticoagulant testing. We will repeat the same test again in 3 months to confirm the diagnosis.  Patient understands that this would mean that he will need to be on anticoagulation for life.

## 2020-09-19 NOTE — Telephone Encounter (Signed)
Per MD request RN attempt x1 to contact pt regarding Xarelto script being sent to [facility] out pt pharmacy to help pt facilitate savings card enrollment.  No answer.  LVM for pt to return call to the office.

## 2020-09-21 ENCOUNTER — Other Ambulatory Visit: Payer: Self-pay

## 2020-09-21 ENCOUNTER — Ambulatory Visit
Admission: RE | Admit: 2020-09-21 | Discharge: 2020-09-21 | Disposition: A | Discharge status: Home / Self Care | Payer: BC Managed Care – PPO | Source: Ambulatory Visit | Attending: Family Medicine | Admitting: Family Medicine

## 2020-09-21 DIAGNOSIS — N281 Cyst of kidney, acquired: Secondary | ICD-10-CM | POA: Diagnosis not present

## 2020-09-21 DIAGNOSIS — R93429 Abnormal radiologic findings on diagnostic imaging of unspecified kidney: Secondary | ICD-10-CM

## 2020-09-21 MED ORDER — GADOBENATE DIMEGLUMINE 529 MG/ML IV SOLN
18.0000 mL | Freq: Once | INTRAVENOUS | Status: AC | PRN
  Administered 2020-09-21: 18 mL via INTRAVENOUS

## 2020-09-25 ENCOUNTER — Other Ambulatory Visit (HOSPITAL_COMMUNITY): Payer: Self-pay

## 2020-10-03 DIAGNOSIS — N281 Cyst of kidney, acquired: Secondary | ICD-10-CM | POA: Diagnosis not present

## 2020-10-03 DIAGNOSIS — C641 Malignant neoplasm of right kidney, except renal pelvis: Secondary | ICD-10-CM | POA: Diagnosis not present

## 2020-10-30 ENCOUNTER — Other Ambulatory Visit: Payer: Self-pay | Admitting: Hematology and Oncology

## 2020-10-30 ENCOUNTER — Other Ambulatory Visit (HOSPITAL_COMMUNITY): Payer: Self-pay

## 2020-10-30 DIAGNOSIS — I2782 Chronic pulmonary embolism: Secondary | ICD-10-CM

## 2020-10-30 MED ORDER — RIVAROXABAN 20 MG PO TABS
20.0000 mg | ORAL_TABLET | Freq: Every day | ORAL | 12 refills | Status: DC
  Filled 2020-10-30: qty 30, 30d supply, fill #0
  Filled 2020-12-08: qty 30, 30d supply, fill #1
  Filled 2020-12-27: qty 30, 30d supply, fill #2
  Filled 2021-02-23: qty 30, 30d supply, fill #3
  Filled 2021-03-26: qty 30, 30d supply, fill #4

## 2020-12-07 DIAGNOSIS — C641 Malignant neoplasm of right kidney, except renal pelvis: Secondary | ICD-10-CM | POA: Diagnosis not present

## 2020-12-07 DIAGNOSIS — Z125 Encounter for screening for malignant neoplasm of prostate: Secondary | ICD-10-CM | POA: Diagnosis not present

## 2020-12-08 ENCOUNTER — Other Ambulatory Visit (HOSPITAL_COMMUNITY): Payer: Self-pay

## 2020-12-14 ENCOUNTER — Telehealth: Payer: Self-pay | Admitting: *Deleted

## 2020-12-14 DIAGNOSIS — C641 Malignant neoplasm of right kidney, except renal pelvis: Secondary | ICD-10-CM | POA: Diagnosis not present

## 2020-12-14 DIAGNOSIS — N281 Cyst of kidney, acquired: Secondary | ICD-10-CM | POA: Diagnosis not present

## 2020-12-14 NOTE — Telephone Encounter (Signed)
Received call from pt stating recent scan with Dr. Phebe Colla at [facility] showed a lump on his kidney and is needing to be scheduled for surgery.  Pt requesting for scan and office note be reviewed by MD for recommendations on Xarelto with surgery.  RN placed call to [facility] and LVM for Dr. Zettie Pho nurse to fax recent office note and imaging results for MD to review.

## 2020-12-18 ENCOUNTER — Other Ambulatory Visit: Payer: Self-pay

## 2020-12-18 ENCOUNTER — Inpatient Hospital Stay: Payer: BC Managed Care – PPO | Attending: Hematology and Oncology

## 2020-12-18 ENCOUNTER — Other Ambulatory Visit: Payer: Self-pay | Admitting: Hematology and Oncology

## 2020-12-18 DIAGNOSIS — D6851 Activated protein C resistance: Secondary | ICD-10-CM | POA: Insufficient documentation

## 2020-12-18 DIAGNOSIS — Z7901 Long term (current) use of anticoagulants: Secondary | ICD-10-CM | POA: Diagnosis not present

## 2020-12-18 DIAGNOSIS — I2782 Chronic pulmonary embolism: Secondary | ICD-10-CM | POA: Diagnosis not present

## 2020-12-18 DIAGNOSIS — Z79899 Other long term (current) drug therapy: Secondary | ICD-10-CM | POA: Diagnosis not present

## 2020-12-18 DIAGNOSIS — D6862 Lupus anticoagulant syndrome: Secondary | ICD-10-CM | POA: Diagnosis not present

## 2020-12-20 LAB — DRVVT CONFIRM: dRVVT Confirm: 1.5 ratio — ABNORMAL HIGH (ref 0.8–1.2)

## 2020-12-20 LAB — LUPUS ANTICOAGULANT PANEL
DRVVT: 56.6 s — ABNORMAL HIGH (ref 0.0–47.0)
PTT Lupus Anticoagulant: 30.8 s (ref 0.0–51.9)

## 2020-12-20 LAB — DRVVT MIX: dRVVT Mix: 47 s — ABNORMAL HIGH (ref 0.0–40.4)

## 2020-12-20 NOTE — Progress Notes (Signed)
°  HEMATOLOGY-ONCOLOGY TELEPHONE VISIT PROGRESS NOTE  I connected with Chris Howard on 12/21/2020 at 11:30 AM EST by telephone and verified that I am speaking with the correct person using two identifiers.  I discussed the limitations, risks, security and privacy concerns of performing an evaluation and management service by telephone and the availability of in person appointments.  I also discussed with the patient that there may be a patient responsible charge related to this service. The patient expressed understanding and agreed to proceed.   History of Present Illness: Chris Howard is a 65 y.o. male with above-mentioned history of chronic pulmonary embolism. He presents via telephone today for follow-up.   Observations/Objective:     Assessment Plan:  Chronic pulmonary embolism ([place]) CT angiogram 08/25/2020: Chronic right pulmonary artery PE with recanalization, no heart strain possible right lung infarct right upper lobe branches, unusual appearance of soft tissue attenuation in the inferior right hilum (felt to be segmental and subsegmental PE with recannulization), right hilar lymph node 2 cm enhancement behind the 3.4 cm cystic lesion   Lab review: 09/12/2020: Lupus anticoagulant positive (repeat testing 12/18/2020: Persistent positivity for lupus anticoagulant) Factor V Leiden and prothrombin gene mutations: Negative Protein S: 95%, protein C: 114%, anticardiolipin antibodies: Negative, Antithrombin III: 105%   Current treatment: Xarelto Tolerating Xarelto extremely well.  Recommendation is lifelong anticoagulation  CT chest abdomen pelvis 12/08/2020: [facility]: No PE, improvement in the right lower lobe infarct, right kidney complex cystic lesion 4.6 x 3.1 cm suspicious for cystic renal cell cancer.  Plan is to undergo partial nephrectomy next year.  Perioperative anticoagulation: 48 hours before surgery: Stop Xarelto Lovenox injections 1 mg/kg subcu twice a day for 48 hours No  anticoagulation on day of surgery Resume Xarelto day after surgery  Return to clinic in 1 year for follow-up   I discussed the assessment and treatment plan with the patient. The patient was provided an opportunity to ask questions and all were answered. The patient agreed with the plan and demonstrated an understanding of the instructions. The patient was advised to call back or seek an in-person evaluation if the symptoms worsen or if the condition fails to improve as anticipated.   Total time spent: 15 mins including non-face to face time and time spent for planning, charting and coordination of care  Rulon Eisenmenger, MD 12/21/2020    I, Thana Ates, am acting as scribe for Nicholas Lose, MD.  I have reviewed the above documentation for accuracy and completeness, and I agree with the above.

## 2020-12-21 ENCOUNTER — Inpatient Hospital Stay (HOSPITAL_BASED_OUTPATIENT_CLINIC_OR_DEPARTMENT_OTHER): Payer: BC Managed Care – PPO | Admitting: Hematology and Oncology

## 2020-12-21 DIAGNOSIS — I2782 Chronic pulmonary embolism: Secondary | ICD-10-CM | POA: Diagnosis not present

## 2020-12-21 NOTE — Assessment & Plan Note (Signed)
CT angiogram 08/25/2020: Chronic right pulmonary artery PE with recanalization, no heart strain possible right lung infarctright upper lobe branches, unusual appearance of soft tissue attenuation in the inferior right hilum (felt to be segmental and subsegmental PE with recannulization), right hilar lymph node 2 cm enhancement behind the 3.4 cm cystic lesion  Lab review: 09/12/2020: Lupus anticoagulant positive (repeat testing 12/18/2020: Persistent positivity for lupus anticoagulant) Factor V Leiden and prothrombin gene mutations: Negative Protein S: 95%, protein C: 114%, anticardiolipin antibodies: Negative, Antithrombin III: 105%  Current treatment: Xarelto He is planning to get Xarelto from [place].     I discussed with the patient that his lab results are consistent for antiphospholipid antibody syndrome and that he needs to remain on anticoagulation for life.

## 2020-12-27 ENCOUNTER — Other Ambulatory Visit (HOSPITAL_COMMUNITY): Payer: Self-pay

## 2020-12-28 ENCOUNTER — Other Ambulatory Visit: Payer: Self-pay | Admitting: Urology

## 2020-12-29 MED ORDER — POLYETHYLENE GLYCOL 3350 17 GM/SCOOP PO POWD: 1.0000 | Freq: Once | ORAL | Status: DC

## 2021-01-02 ENCOUNTER — Other Ambulatory Visit (HOSPITAL_COMMUNITY): Payer: Self-pay

## 2021-01-11 ENCOUNTER — Encounter: Payer: Self-pay | Admitting: Hematology and Oncology

## 2021-01-12 ENCOUNTER — Other Ambulatory Visit: Payer: Self-pay | Admitting: Hematology and Oncology

## 2021-01-12 ENCOUNTER — Telehealth: Payer: Self-pay

## 2021-01-12 ENCOUNTER — Ambulatory Visit (HOSPITAL_BASED_OUTPATIENT_CLINIC_OR_DEPARTMENT_OTHER): Payer: BC Managed Care – PPO

## 2021-01-12 DIAGNOSIS — I2782 Chronic pulmonary embolism: Secondary | ICD-10-CM

## 2021-01-12 DIAGNOSIS — M542 Cervicalgia: Secondary | ICD-10-CM

## 2021-01-12 NOTE — Telephone Encounter (Signed)
Called pt to inform him of MD wanting CT scan of neck, this is scheduled for 1/6 @ 2:15.  Pt verbalized understanding.

## 2021-01-12 NOTE — Progress Notes (Signed)
Complaining of sudden onset of neck pain for the past 3 to 4 days.  It is very intense and feels like somebody hit the neck with a baseball bat.  He does not remember injuring or doing anything exertional.  He has a probable history of renal cell cancer and pulmonary embolism. We would like to obtain a CT of the neck with contrast for further evaluation.

## 2021-01-15 ENCOUNTER — Other Ambulatory Visit: Payer: Self-pay

## 2021-01-15 ENCOUNTER — Ambulatory Visit (HOSPITAL_BASED_OUTPATIENT_CLINIC_OR_DEPARTMENT_OTHER)
Admission: RE | Admit: 2021-01-15 | Discharge: 2021-01-15 | Disposition: A | Discharge status: Home / Self Care | Payer: BC Managed Care – PPO | Source: Ambulatory Visit | Attending: Hematology and Oncology | Admitting: Hematology and Oncology

## 2021-01-15 ENCOUNTER — Encounter (HOSPITAL_BASED_OUTPATIENT_CLINIC_OR_DEPARTMENT_OTHER): Payer: Self-pay

## 2021-01-15 DIAGNOSIS — I2782 Chronic pulmonary embolism: Secondary | ICD-10-CM | POA: Insufficient documentation

## 2021-01-15 DIAGNOSIS — K118 Other diseases of salivary glands: Secondary | ICD-10-CM | POA: Diagnosis not present

## 2021-01-15 DIAGNOSIS — M542 Cervicalgia: Secondary | ICD-10-CM | POA: Insufficient documentation

## 2021-01-15 LAB — POCT I-STAT CREATININE: Creatinine, Ser: 0.8 mg/dL (ref 0.61–1.24)

## 2021-01-15 MED ORDER — IOHEXOL 300 MG/ML  SOLN
75.0000 mL | Freq: Once | INTRAMUSCULAR | Status: AC | PRN
  Administered 2021-01-15: 75 mL via INTRAVENOUS

## 2021-01-16 ENCOUNTER — Telehealth: Payer: Self-pay | Admitting: Hematology and Oncology

## 2021-01-16 ENCOUNTER — Other Ambulatory Visit: Payer: Self-pay

## 2021-01-16 DIAGNOSIS — M545 Low back pain, unspecified: Secondary | ICD-10-CM

## 2021-01-16 NOTE — Telephone Encounter (Signed)
Discussed the scan results Pain still ongoing Will refer to orthopedics.

## 2021-01-22 DIAGNOSIS — M47892 Other spondylosis, cervical region: Secondary | ICD-10-CM | POA: Diagnosis not present

## 2021-01-22 DIAGNOSIS — Z6827 Body mass index (BMI) 27.0-27.9, adult: Secondary | ICD-10-CM | POA: Diagnosis not present

## 2021-01-22 DIAGNOSIS — M542 Cervicalgia: Secondary | ICD-10-CM | POA: Diagnosis not present

## 2021-01-23 DIAGNOSIS — N281 Cyst of kidney, acquired: Secondary | ICD-10-CM | POA: Diagnosis not present

## 2021-01-23 DIAGNOSIS — C641 Malignant neoplasm of right kidney, except renal pelvis: Secondary | ICD-10-CM | POA: Diagnosis not present

## 2021-01-24 ENCOUNTER — Other Ambulatory Visit: Payer: Self-pay | Admitting: *Deleted

## 2021-01-24 ENCOUNTER — Other Ambulatory Visit (HOSPITAL_COMMUNITY): Payer: Self-pay

## 2021-01-24 ENCOUNTER — Encounter: Payer: Self-pay | Admitting: *Deleted

## 2021-01-24 MED ORDER — ENOXAPARIN SODIUM 80 MG/0.8ML IJ SOSY
80.0000 mg | PREFILLED_SYRINGE | Freq: Two times a day (BID) | INTRAMUSCULAR | 0 refills | Status: DC
  Filled 2021-01-24: qty 3.2, 2d supply, fill #0

## 2021-01-24 NOTE — Progress Notes (Signed)
RN successfully faxed medical clearance form to [facility] with recommendations regarding Xarelto-Lovenox bridge prior to surgery.

## 2021-01-24 NOTE — Progress Notes (Signed)
Pt currently scheduled to undergo Right Robotic Partial Nephrectomy on 02/07/21.  Per MD pt needing to stop Xarelto 2 days prior to surgery and resume the day after surgery.  MD also suggest pt bridge with Lovenox two days prior to surgery.  Verbal orders received for pt to self administer Lovenox 80 mg subcu BID two days prior to surgery.  RN called pt and educated pt to take his last dose of Xarelto on 02/04/21 and to self administer Lovenox 80 mg subcu BID on 02/05/21 and 02/06/21.  Pt to not receive any blood thinning medications day of surgery (02/07/21) and resume Xarelto day after surgery. Prescription sent to pharmacy on file, pt educated and verbalized understanding.

## 2021-01-29 ENCOUNTER — Other Ambulatory Visit (HOSPITAL_COMMUNITY): Payer: Self-pay

## 2021-02-02 NOTE — Patient Instructions (Addendum)
DUE TO COVID-19 ONLY ONE VISITOR IS ALLOWED TO COME WITH YOU AND STAY IN THE WAITING ROOM ONLY DURING PRE OP AND PROCEDURE.   **NO VISITORS ARE ALLOWED IN THE SHORT STAY AREA OR RECOVERY ROOM!!**  IF YOU WILL BE ADMITTED INTO THE HOSPITAL YOU ARE ALLOWED ONLY TWO SUPPORT PEOPLE DURING VISITATION HOURS ONLY (7 AM -8PM)   The support person(s) must pass our screening, gel in and out, and wear a mask at all times, including in the patients room. Patients must also wear a mask when staff or their support person are in the room. Visitors GUEST BADGE MUST BE WORN VISIBLY  One adult visitor may remain with you overnight and MUST be in the room by 8 P.M.  No visitors under the age of 68. Any visitor under the age of 74 must be accompanied by an adult.    COVID SWAB TESTING MUST BE COMPLETED ON:  02/05/21   Site: [facility] [place] Enter: Main Entrance have a seat in the waiting area to the right of main entrance (DO NOT [place]!!!!!) Dial: [phone] to alert staff you have arrived  You are not required to quarantine, however you are required to wear a well-fitted mask when you are out and around people not in your household.  Hand Hygiene often Do NOT share personal items Notify your provider if you are in close contact with someone who has COVID or you develop fever 100.4 or greater, new onset of sneezing, cough, sore throat, shortness of breath or body aches.  [address], [address], must go inside of the hospital, NOT A DRIVE THRU!  (Must self quarantine after testing. Follow instructions on handout.)       Your procedure is scheduled on: 02/07/21   Report to [facility] Main Entrance    Report to admitting at : 9:45 AM   Call this number if you have problems the morning of surgery [phone]   Do not eat food :After Midnight.  CLEAR LIQUID DIET: STARTING THE  DAY BEFORE SURGERY UNTIL: 9:00 AM  Foods Allowed                                                                     Foods Excluded  Water, Black Coffee and tea, regular and decaf                             liquids that you cannot  Plain Jell-O in any flavor  (No red)                                           see through such as: Fruit ices (not with fruit pulp)                                     milk, soups, orange juice  Iced Popsicles (No red)                                    All solid food                                   Apple juices Sports drinks like Gatorade (No red) Lightly seasoned clear broth or consume(fat free) Sugar Sample Menu Breakfast                                Lunch                                     Supper Cranberry juice                    Beef broth                            Chicken broth Jell-O                                     Grape juice                           Apple juice Coffee or tea                        Jell-O                                      Popsicle                                                Coffee or tea                        Coffee or tea   FOLLOW BOWEL PREP INSTRUCTIONS YOU RECEIVED FROM YOUR SURGEON'S OFFICE!!!    Stop taking _Xarelto__________on __2 days before surgery________as instructed by _____________.  Stop taking ____________as directed by your Surgeon/Cardiologist.  Contact your Surgeon/Cardiologist for instructions on Anticoagulant Therapy prior to surgery.    Oral Hygiene is also important to reduce your risk of infection.                                    Remember - BRUSH YOUR TEETH THE MORNING OF SURGERY WITH YOUR REGULAR TOOTHPASTE   Do NOT smoke after Midnight   Take these medicines the morning of surgery with A SIP OF WATER: N/A  DO NOT TAKE ANY ORAL DIABETIC MEDICATIONS DAY OF YOUR SURGERY                              You may not have any metal  on your body including hair pins, jewelry, and  body piercing             Do not wear lotions, powders, perfumes/cologne, or deodorant              Men may shave face and neck.   Do not bring valuables to the hospital. [place].   Contacts, dentures or bridgework may not be worn into surgery.   Bring small overnight bag day of surgery.    Patients discharged on the day of surgery will not be allowed to drive home.  Someone needs to stay with you for the first 24 hours after anesthesia.   Special Instructions: Bring a copy of your healthcare power of attorney and living will documents         the day of surgery if you haven't scanned them before.              Please read over the following fact sheets you were given: IF YOU HAVE QUESTIONS ABOUT YOUR PRE-OP INSTRUCTIONS PLEASE CALL [phone]     [facility] - Preparing for Surgery Before surgery, you can play an important role.  Because skin is not sterile, your skin needs to be as free of germs as possible.  You can reduce the number of germs on your skin by washing with CHG (chlorahexidine gluconate) soap before surgery.  CHG is an antiseptic cleaner which kills germs and bonds with the skin to continue killing germs even after washing. Please DO NOT use if you have an allergy to CHG or antibacterial soaps.  If your skin becomes reddened/irritated stop using the CHG and inform your nurse when you arrive at Short Stay. Do not shave (including legs and underarms) for at least 48 hours prior to the first CHG shower.  You may shave your face/neck. Please follow these instructions carefully:  1.  Shower with CHG Soap the night before surgery and the  morning of Surgery.  2.  If you choose to wash your hair, wash your hair first as usual with your  normal  shampoo.  3.  After you shampoo, rinse your hair and body thoroughly to remove the  shampoo.                           4.  Use CHG as you would any other liquid soap.  You can apply chg  directly  to the skin and wash                       Gently with a scrungie or clean washcloth.  5.  Apply the CHG Soap to your body ONLY FROM THE NECK DOWN.   Do not use on face/ open                           Wound or open sores. Avoid contact with eyes, ears mouth and genitals (private parts).                       Wash face,  Genitals (private parts) with your normal soap.             6.  Wash thoroughly, paying special attention to the area where your surgery  will be performed.  7.  Thoroughly rinse your body with warm water from the neck down.  8.  DO NOT shower/wash with your normal soap after using and rinsing off  the CHG Soap.                9.  Pat yourself dry with a clean towel.            10.  Wear clean pajamas.            11.  Place clean sheets on your bed the night of your first shower and do not  sleep with pets. Day of Surgery : Do not apply any lotions/deodorants the morning of surgery.  Please wear clean clothes to the hospital/surgery center.  FAILURE TO FOLLOW THESE INSTRUCTIONS MAY RESULT IN THE CANCELLATION OF YOUR SURGERY PATIENT SIGNATURE_________________________________  NURSE SIGNATURE__________________________________  ________________________________________________________________________

## 2021-02-05 ENCOUNTER — Encounter (HOSPITAL_COMMUNITY)
Admission: RE | Admit: 2021-02-05 | Discharge: 2021-02-05 | Disposition: A | Discharge status: Home / Self Care | Payer: Medicare Other | Source: Ambulatory Visit | Attending: Urology | Admitting: Urology

## 2021-02-05 ENCOUNTER — Encounter (HOSPITAL_COMMUNITY): Payer: BC Managed Care – PPO

## 2021-02-05 ENCOUNTER — Encounter (HOSPITAL_COMMUNITY): Payer: Self-pay

## 2021-02-05 ENCOUNTER — Other Ambulatory Visit: Payer: Self-pay

## 2021-02-05 VITALS — HR 97 | Temp 97.7°F | Ht 70.5 in | Wt 193.0 lb

## 2021-02-05 DIAGNOSIS — Z01812 Encounter for preprocedural laboratory examination: Secondary | ICD-10-CM

## 2021-02-05 DIAGNOSIS — Z20822 Contact with and (suspected) exposure to covid-19: Secondary | ICD-10-CM | POA: Insufficient documentation

## 2021-02-05 DIAGNOSIS — Z01818 Encounter for other preprocedural examination: Secondary | ICD-10-CM

## 2021-02-05 HISTORY — DX: Chronic kidney disease, unspecified: N18.9

## 2021-02-05 LAB — BASIC METABOLIC PANEL
Anion gap: 5 (ref 5–15)
BUN: 14 mg/dL (ref 8–23)
CO2: 28 mmol/L (ref 22–32)
Calcium: 9.7 mg/dL (ref 8.9–10.3)
Chloride: 107 mmol/L (ref 98–111)
Creatinine, Ser: 0.65 mg/dL (ref 0.61–1.24)
GFR, Estimated: >60 mL/min (ref >60)
Glucose, Bld: 99 mg/dL (ref 70–99)
Potassium: 4.5 mmol/L (ref 3.5–5.1)
Sodium: 140 mmol/L (ref 135–145)

## 2021-02-05 LAB — CBC
HCT: 45 % (ref 39.0–52.0)
Hemoglobin: 15.2 g/dL (ref 13.0–17.0)
MCH: 30.3 pg (ref 26.0–34.0)
MCHC: 33.8 g/dL (ref 30.0–36.0)
MCV: 89.6 fL (ref 80.0–100.0)
Platelets: 268 10*3/uL (ref 150–400)
RBC: 5.02 MIL/uL (ref 4.22–5.81)
RDW: 13 % (ref 11.5–15.5)
WBC: 5.3 10*3/uL (ref 4.0–10.5)
nRBC: 0 % (ref 0.0–0.2)

## 2021-02-05 NOTE — Progress Notes (Addendum)
COVID Vaccine Completed: NO Date COVID Vaccine completed: COVID vaccine manufacturer: [place] Test: 02/05/21 @ 9:45 AM PCP - [facility]ie [facility] Physician Cardiologist -   Chest x-ray -  EKG -  Stress Test -  ECHO -  Cardiac Cath -  Pacemaker/ICD device last checked:  Sleep Study -  CPAP -   Fasting Blood Sugar -  Checks Blood Sugar _____ times a day  Blood Thinner Instructions: Xarelto should be stop 2 days prior surgery. oN HOLD SINCE 02/04/21.Lovenox will be start on 02/05/21. Aspirin Instructions: Last Dose:  Anesthesia review: Hx: PE  Patient denies shortness of breath, fever, cough and chest pain at PAT appointment   Patient verbalized understanding of instructions that were given to them at the PAT appointment. Patient was also instructed that they will need to review over the PAT instructions again at home before surgery.

## 2021-02-06 LAB — SARS CORONAVIRUS 2 (TAT 6-24 HRS): SARS Coronavirus 2: NEGATIVE

## 2021-02-07 ENCOUNTER — Inpatient Hospital Stay (HOSPITAL_COMMUNITY)
Admission: RE | Admit: 2021-02-07 | Discharge: 2021-02-10 | DRG: 657 | Disposition: A | Discharge status: Home / Self Care | Payer: Medicare Other | Source: Ambulatory Visit | Attending: Urology | Admitting: Urology

## 2021-02-07 ENCOUNTER — Ambulatory Visit (HOSPITAL_COMMUNITY): Payer: Medicare Other | Admitting: Physician Assistant

## 2021-02-07 ENCOUNTER — Other Ambulatory Visit: Payer: Self-pay

## 2021-02-07 ENCOUNTER — Other Ambulatory Visit (HOSPITAL_COMMUNITY): Payer: Self-pay

## 2021-02-07 ENCOUNTER — Encounter (HOSPITAL_COMMUNITY): Payer: Self-pay | Admitting: Urology

## 2021-02-07 ENCOUNTER — Encounter (HOSPITAL_COMMUNITY)
Admission: RE | Disposition: A | Discharge status: Home / Self Care | Payer: Self-pay | Source: Ambulatory Visit | Attending: Urology

## 2021-02-07 DIAGNOSIS — D6851 Activated protein C resistance: Secondary | ICD-10-CM | POA: Diagnosis present

## 2021-02-07 DIAGNOSIS — N2889 Other specified disorders of kidney and ureter: Secondary | ICD-10-CM | POA: Diagnosis present

## 2021-02-07 DIAGNOSIS — Z86711 Personal history of pulmonary embolism: Secondary | ICD-10-CM | POA: Diagnosis not present

## 2021-02-07 DIAGNOSIS — Z01812 Encounter for preprocedural laboratory examination: Secondary | ICD-10-CM

## 2021-02-07 DIAGNOSIS — C641 Malignant neoplasm of right kidney, except renal pelvis: Principal | ICD-10-CM | POA: Diagnosis present

## 2021-02-07 DIAGNOSIS — N281 Cyst of kidney, acquired: Secondary | ICD-10-CM | POA: Diagnosis present

## 2021-02-07 DIAGNOSIS — Z20822 Contact with and (suspected) exposure to covid-19: Secondary | ICD-10-CM | POA: Diagnosis present

## 2021-02-07 DIAGNOSIS — Z7901 Long term (current) use of anticoagulants: Secondary | ICD-10-CM

## 2021-02-07 DIAGNOSIS — Z87891 Personal history of nicotine dependence: Secondary | ICD-10-CM

## 2021-02-07 HISTORY — PX: ROBOTIC ASSITED PARTIAL NEPHRECTOMY: SHX6087

## 2021-02-07 LAB — TYPE AND SCREEN
ABO/RH(D): A POS
Antibody Screen: NEGATIVE

## 2021-02-07 LAB — CBC
HCT: 42 % (ref 39.0–52.0)
Hemoglobin: 14.2 g/dL (ref 13.0–17.0)
MCH: 30.2 pg (ref 26.0–34.0)
MCHC: 33.8 g/dL (ref 30.0–36.0)
MCV: 89.4 fL (ref 80.0–100.0)
Platelets: 219 10*3/uL (ref 150–400)
RBC: 4.7 MIL/uL (ref 4.22–5.81)
RDW: 12.9 % (ref 11.5–15.5)
WBC: 8.7 10*3/uL (ref 4.0–10.5)
nRBC: 0 % (ref 0.0–0.2)

## 2021-02-07 LAB — BASIC METABOLIC PANEL
Anion gap: 7 (ref 5–15)
BUN: 11 mg/dL (ref 8–23)
CO2: 25 mmol/L (ref 22–32)
Calcium: 8.9 mg/dL (ref 8.9–10.3)
Chloride: 104 mmol/L (ref 98–111)
Creatinine, Ser: 0.87 mg/dL (ref 0.61–1.24)
GFR, Estimated: >60 mL/min (ref >60)
Glucose, Bld: 145 mg/dL — ABNORMAL HIGH (ref 70–99)
Potassium: 4.1 mmol/L (ref 3.5–5.1)
Sodium: 136 mmol/L (ref 135–145)

## 2021-02-07 LAB — ABO/RH: ABO/RH(D): A POS

## 2021-02-07 LAB — GLUCOSE, CAPILLARY: Glucose-Capillary: 193 mg/dL — ABNORMAL HIGH (ref 70–99)

## 2021-02-07 SURGERY — NEPHRECTOMY, PARTIAL, ROBOT-ASSISTED
Anesthesia: General | Site: Abdomen | Laterality: Right

## 2021-02-07 MED ORDER — LACTATED RINGERS IV SOLN: INTRAVENOUS | Status: DC

## 2021-02-07 MED ORDER — ONDANSETRON HCL 4 MG/2ML IJ SOLN
4.0000 mg | INTRAMUSCULAR | Status: DC | PRN
  Administered 2021-02-08: 4 mg via INTRAVENOUS
  Filled 2021-02-07: qty 2

## 2021-02-07 MED ORDER — 0.9 % SODIUM CHLORIDE (POUR BTL) OPTIME
TOPICAL | Status: DC | PRN
  Administered 2021-02-07: 1000 mL

## 2021-02-07 MED ORDER — BUPIVACAINE LIPOSOME 1.3 % IJ SUSP
INTRAMUSCULAR | Status: AC
  Filled 2021-02-07: qty 20

## 2021-02-07 MED ORDER — LIDOCAINE 2% (20 MG/ML) 5 ML SYRINGE
INTRAMUSCULAR | Status: DC | PRN
  Administered 2021-02-07: 60 mg via INTRAVENOUS

## 2021-02-07 MED ORDER — CEFAZOLIN SODIUM-DEXTROSE 1-4 GM/50ML-% IV SOLN
1.0000 g | Freq: Three times a day (TID) | INTRAVENOUS | Status: AC
  Administered 2021-02-07 – 2021-02-08 (×2): 1 g via INTRAVENOUS
  Filled 2021-02-07 (×2): qty 50

## 2021-02-07 MED ORDER — HYDROMORPHONE HCL 1 MG/ML IJ SOLN
0.5000 mg | INTRAMUSCULAR | Status: DC | PRN
  Administered 2021-02-07 – 2021-02-08 (×2): 1 mg via INTRAVENOUS
  Administered 2021-02-09: 0.5 mg via INTRAVENOUS
  Filled 2021-02-07 (×3): qty 1

## 2021-02-07 MED ORDER — FENTANYL CITRATE (PF) 250 MCG/5ML IJ SOLN
INTRAMUSCULAR | Status: AC
  Filled 2021-02-07: qty 5

## 2021-02-07 MED ORDER — HYDROCODONE-ACETAMINOPHEN 5-325 MG PO TABS
1.0000 | ORAL_TABLET | Freq: Four times a day (QID) | ORAL | 0 refills | Status: DC | PRN
  Filled 2021-02-07: qty 20, 3d supply, fill #0

## 2021-02-07 MED ORDER — CHLORHEXIDINE GLUCONATE 0.12 % MT SOLN: 15.0000 mL | Freq: Once | OROMUCOSAL | Status: AC

## 2021-02-07 MED ORDER — PHENYLEPHRINE HCL (PRESSORS) 10 MG/ML IV SOLN
INTRAVENOUS | Status: AC
  Filled 2021-02-07: qty 1

## 2021-02-07 MED ORDER — SODIUM CHLORIDE (PF) 0.9 % IJ SOLN
INTRAMUSCULAR | Status: AC
  Filled 2021-02-07: qty 20

## 2021-02-07 MED ORDER — ONDANSETRON HCL 4 MG/2ML IJ SOLN
INTRAMUSCULAR | Status: DC | PRN
  Administered 2021-02-07: 4 mg via INTRAVENOUS

## 2021-02-07 MED ORDER — ROCURONIUM BROMIDE 10 MG/ML (PF) SYRINGE
PREFILLED_SYRINGE | INTRAVENOUS | Status: AC
  Filled 2021-02-07: qty 30

## 2021-02-07 MED ORDER — DOCUSATE SODIUM 100 MG PO CAPS
100.0000 mg | ORAL_CAPSULE | Freq: Two times a day (BID) | ORAL | Status: DC
  Administered 2021-02-07 – 2021-02-10 (×4): 100 mg via ORAL
  Filled 2021-02-07 (×5): qty 1

## 2021-02-07 MED ORDER — SURGIFLO WITH THROMBIN (HEMOSTATIC MATRIX KIT) OPTIME
TOPICAL | Status: DC | PRN
  Administered 2021-02-07: 1

## 2021-02-07 MED ORDER — SUGAMMADEX SODIUM 200 MG/2ML IV SOLN
INTRAVENOUS | Status: DC | PRN
  Administered 2021-02-07: 200 mg via INTRAVENOUS

## 2021-02-07 MED ORDER — PHENYLEPHRINE 40 MCG/ML (10ML) SYRINGE FOR IV PUSH (FOR BLOOD PRESSURE SUPPORT)
PREFILLED_SYRINGE | INTRAVENOUS | Status: DC | PRN
  Administered 2021-02-07 (×2): 80 ug via INTRAVENOUS

## 2021-02-07 MED ORDER — FENTANYL CITRATE PF 50 MCG/ML IJ SOSY
25.0000 ug | PREFILLED_SYRINGE | INTRAMUSCULAR | Status: DC | PRN
  Administered 2021-02-07 (×3): 50 ug via INTRAVENOUS

## 2021-02-07 MED ORDER — CHLORHEXIDINE GLUCONATE CLOTH 2 % EX PADS
6.0000 | MEDICATED_PAD | Freq: Every day | CUTANEOUS | Status: DC
  Administered 2021-02-08 – 2021-02-09 (×2): 6 via TOPICAL

## 2021-02-07 MED ORDER — FENTANYL CITRATE (PF) 100 MCG/2ML IJ SOLN
INTRAMUSCULAR | Status: DC | PRN
Start: 2021-02-07 — End: 2021-02-07
  Administered 2021-02-07: 100 ug via INTRAVENOUS
  Administered 2021-02-07: 50 ug via INTRAVENOUS

## 2021-02-07 MED ORDER — CEFAZOLIN SODIUM-DEXTROSE 2-4 GM/100ML-% IV SOLN
2.0000 g | INTRAVENOUS | Status: AC
  Administered 2021-02-07: 2 g via INTRAVENOUS
  Filled 2021-02-07: qty 100

## 2021-02-07 MED ORDER — BACITRACIN-NEOMYCIN-POLYMYXIN 400-5-5000 EX OINT: 1.0000 "application " | TOPICAL_OINTMENT | Freq: Three times a day (TID) | CUTANEOUS | Status: DC | PRN

## 2021-02-07 MED ORDER — ROCURONIUM BROMIDE 10 MG/ML (PF) SYRINGE
PREFILLED_SYRINGE | INTRAVENOUS | Status: DC | PRN
  Administered 2021-02-07: 70 mg via INTRAVENOUS
  Administered 2021-02-07 (×2): 30 mg via INTRAVENOUS

## 2021-02-07 MED ORDER — PROPOFOL 10 MG/ML IV BOLUS
INTRAVENOUS | Status: DC | PRN
Start: 2021-02-07 — End: 2021-02-07
  Administered 2021-02-07: 170 mg via INTRAVENOUS

## 2021-02-07 MED ORDER — SODIUM CHLORIDE 0.9% FLUSH
INTRAVENOUS | Status: DC | PRN
  Administered 2021-02-07: 20 mL

## 2021-02-07 MED ORDER — TRAMADOL HCL 50 MG PO TABS
100.0000 mg | ORAL_TABLET | ORAL | Status: DC | PRN
  Administered 2021-02-07: 100 mg via ORAL
  Filled 2021-02-07: qty 2

## 2021-02-07 MED ORDER — PROMETHAZINE HCL 25 MG/ML IJ SOLN: 6.2500 mg | INTRAMUSCULAR | Status: DC | PRN

## 2021-02-07 MED ORDER — LACTATED RINGERS IR SOLN
Status: DC | PRN
  Administered 2021-02-07: 1000 mL

## 2021-02-07 MED ORDER — MIDAZOLAM HCL 2 MG/2ML IJ SOLN
INTRAMUSCULAR | Status: AC
  Filled 2021-02-07: qty 2

## 2021-02-07 MED ORDER — SODIUM CHLORIDE 0.9 % IV SOLN: INTRAVENOUS | Status: DC

## 2021-02-07 MED ORDER — EPHEDRINE SULFATE-NACL 50-0.9 MG/10ML-% IV SOSY
PREFILLED_SYRINGE | INTRAVENOUS | Status: DC | PRN
  Administered 2021-02-07 (×3): 10 mg via INTRAVENOUS

## 2021-02-07 MED ORDER — TRAMADOL HCL 50 MG PO TABS
50.0000 mg | ORAL_TABLET | ORAL | Status: DC | PRN
  Administered 2021-02-07 – 2021-02-09 (×5): 50 mg via ORAL
  Filled 2021-02-07 (×5): qty 1

## 2021-02-07 MED ORDER — DEXAMETHASONE SODIUM PHOSPHATE 10 MG/ML IJ SOLN
INTRAMUSCULAR | Status: DC | PRN
  Administered 2021-02-07: 5 mg via INTRAVENOUS

## 2021-02-07 MED ORDER — PROPOFOL 10 MG/ML IV BOLUS
INTRAVENOUS | Status: AC
  Filled 2021-02-07: qty 20

## 2021-02-07 MED ORDER — LACTATED RINGERS IV SOLN
INTRAVENOUS | Status: DC | PRN
Start: 2021-02-07 — End: 2021-02-07

## 2021-02-07 MED ORDER — DOCUSATE SODIUM 100 MG PO CAPS: 100.0000 mg | ORAL_CAPSULE | Freq: Two times a day (BID) | ORAL | Status: DC

## 2021-02-07 MED ORDER — ACETAMINOPHEN 10 MG/ML IV SOLN
INTRAVENOUS | Status: AC
  Filled 2021-02-07: qty 100

## 2021-02-07 MED ORDER — FENTANYL CITRATE PF 50 MCG/ML IJ SOSY
PREFILLED_SYRINGE | INTRAMUSCULAR | Status: AC
  Filled 2021-02-07: qty 3

## 2021-02-07 MED ORDER — ACETAMINOPHEN 10 MG/ML IV SOLN
1000.0000 mg | Freq: Four times a day (QID) | INTRAVENOUS | Status: AC
  Administered 2021-02-07 – 2021-02-08 (×4): 1000 mg via INTRAVENOUS
  Filled 2021-02-07 (×4): qty 100

## 2021-02-07 MED ORDER — BUPIVACAINE LIPOSOME 1.3 % IJ SUSP
INTRAMUSCULAR | Status: DC | PRN
  Administered 2021-02-07: 20 mL

## 2021-02-07 MED ORDER — PHENYLEPHRINE HCL-NACL 20-0.9 MG/250ML-% IV SOLN
INTRAVENOUS | Status: DC | PRN
  Administered 2021-02-07: 50 ug/min via INTRAVENOUS

## 2021-02-07 MED ORDER — ORAL CARE MOUTH RINSE
15.0000 mL | Freq: Once | OROMUCOSAL | Status: AC
  Administered 2021-02-07: 15 mL via OROMUCOSAL

## 2021-02-07 MED ORDER — ACETAMINOPHEN 10 MG/ML IV SOLN
1000.0000 mg | Freq: Once | INTRAVENOUS | Status: DC | PRN
  Administered 2021-02-07: 1000 mg via INTRAVENOUS

## 2021-02-07 MED ORDER — MIDAZOLAM HCL 5 MG/5ML IJ SOLN
INTRAMUSCULAR | Status: DC | PRN
  Administered 2021-02-07: 2 mg via INTRAVENOUS

## 2021-02-07 SURGICAL SUPPLY — 71 items
APPLICATOR SURGIFLO ENDO (HEMOSTASIS) ×2 IMPLANT
BAG COUNTER SPONGE SURGICOUNT (BAG) IMPLANT
CHLORAPREP W/TINT 26 (MISCELLANEOUS) ×2 IMPLANT
CLIP LIGATING HEM O LOK PURPLE (MISCELLANEOUS) ×4 IMPLANT
CLIP LIGATING HEMO LOK XL GOLD (MISCELLANEOUS) IMPLANT
CLIP LIGATING HEMO O LOK GREEN (MISCELLANEOUS) ×4 IMPLANT
CLIP SUT LAPRA TY ABSORB (SUTURE) ×2 IMPLANT
COVER SURGICAL LIGHT HANDLE (MISCELLANEOUS) ×2 IMPLANT
COVER TIP SHEARS 8 DVNC (MISCELLANEOUS) ×1 IMPLANT
COVER TIP SHEARS 8MM DA VINCI (MISCELLANEOUS) ×2
CUTTER ECHEON FLEX ENDO 45 340 (ENDOMECHANICALS) IMPLANT
DERMABOND ADVANCED (GAUZE/BANDAGES/DRESSINGS) ×1
DERMABOND ADVANCED .7 DNX12 (GAUZE/BANDAGES/DRESSINGS) ×1 IMPLANT
DRAIN CHANNEL 15F RND FF 3/16 (WOUND CARE) ×2 IMPLANT
DRAPE ARM DVNC X/XI (DISPOSABLE) ×4 IMPLANT
DRAPE COLUMN DVNC XI (DISPOSABLE) ×1 IMPLANT
DRAPE DA VINCI XI ARM (DISPOSABLE) ×8
DRAPE DA VINCI XI COLUMN (DISPOSABLE) ×2
DRAPE INCISE IOBAN 66X45 STRL (DRAPES) ×2 IMPLANT
DRAPE SHEET LG 3/4 BI-LAMINATE (DRAPES) ×2 IMPLANT
DRSG TEGADERM 4X4.75 (GAUZE/BANDAGES/DRESSINGS) ×2 IMPLANT
ELECT PENCIL ROCKER SW 15FT (MISCELLANEOUS) ×2 IMPLANT
ELECT REM PT RETURN 15FT ADLT (MISCELLANEOUS) ×2 IMPLANT
EVACUATOR SILICONE 100CC (DRAIN) ×2 IMPLANT
GAUZE 4X4 16PLY ~~LOC~~+RFID DBL (SPONGE) ×2 IMPLANT
GLOVE SURG ENC MOIS LTX SZ6.5 (GLOVE) ×2 IMPLANT
GLOVE SURG ENC TEXT LTX SZ7.5 (GLOVE) ×4 IMPLANT
GOWN STRL REUS W/TWL LRG LVL3 (GOWN DISPOSABLE) ×6 IMPLANT
HEMOSTAT SURGICEL 4X8 (HEMOSTASIS) ×2 IMPLANT
HOLDER FOLEY CATH W/STRAP (MISCELLANEOUS) ×2 IMPLANT
IRRIG SUCT STRYKERFLOW 2 WTIP (MISCELLANEOUS) ×2
IRRIGATION SUCT STRKRFLW 2 WTP (MISCELLANEOUS) ×1 IMPLANT
KIT BASIN OR (CUSTOM PROCEDURE TRAY) ×2 IMPLANT
KIT TURNOVER KIT A (KITS) IMPLANT
LOOP VESSEL MAXI BLUE (MISCELLANEOUS) ×2 IMPLANT
MARKER SKIN DUAL TIP RULER LAB (MISCELLANEOUS) ×2 IMPLANT
NDL INSUFFLATION 14GA 120MM (NEEDLE) ×1 IMPLANT
NEEDLE INSUFFLATION 14GA 120MM (NEEDLE) ×2 IMPLANT
PORT ACCESS TROCAR AIRSEAL 12 (TROCAR) ×1 IMPLANT
PORT ACCESS TROCAR AIRSEAL 5M (TROCAR) ×1
POUCH SPECIMEN RETRIEVAL 10MM (ENDOMECHANICALS) ×2 IMPLANT
PROTECTOR NERVE ULNAR (MISCELLANEOUS) ×4 IMPLANT
RELOAD STAPLE 45 2.6 WHT THIN (STAPLE) IMPLANT
SEAL CANN UNIV 5-8 DVNC XI (MISCELLANEOUS) ×4 IMPLANT
SEAL XI 5MM-8MM UNIVERSAL (MISCELLANEOUS) ×8
SET TRI-LUMEN FLTR TB AIRSEAL (TUBING) ×2 IMPLANT
SOLUTION ELECTROLUBE (MISCELLANEOUS) ×2 IMPLANT
SPIKE FLUID TRANSFER (MISCELLANEOUS) ×2 IMPLANT
SPONGE T-LAP 4X18 ~~LOC~~+RFID (SPONGE) ×2 IMPLANT
STAPLE RELOAD 45 WHT (STAPLE) IMPLANT
STAPLE RELOAD 45MM WHITE (STAPLE)
SURGIFLO W/THROMBIN 8M KIT (HEMOSTASIS) ×2 IMPLANT
SUT ETHILON 3 0 PS 1 (SUTURE) ×2 IMPLANT
SUT MNCRL AB 4-0 PS2 18 (SUTURE) ×4 IMPLANT
SUT PDS AB 1 CT1 27 (SUTURE) ×4 IMPLANT
SUT V-LOC BARB 180 2/0GR6 GS22 (SUTURE)
SUT VIC AB 0 CT1 27 (SUTURE) ×8
SUT VIC AB 0 CT1 27XBRD ANTBC (SUTURE) ×4 IMPLANT
SUT VIC AB 2-0 SH 27 (SUTURE) ×4
SUT VIC AB 2-0 SH 27X BRD (SUTURE) ×2 IMPLANT
SUT VLOC 180 2-0 6IN GS21 (SUTURE) ×1 IMPLANT
SUT VLOC BARB 180 ABS3/0GR12 (SUTURE) ×2
SUTURE V-LC BRB 180 2/0GR6GS22 (SUTURE) IMPLANT
SUTURE VLOC BRB 180 ABS3/0GR12 (SUTURE) ×1 IMPLANT
TOWEL OR 17X26 10 PK STRL BLUE (TOWEL DISPOSABLE) ×2 IMPLANT
TOWEL OR NON WOVEN STRL DISP B (DISPOSABLE) ×2 IMPLANT
TRAY FOLEY MTR SLVR 16FR STAT (SET/KITS/TRAYS/PACK) ×2 IMPLANT
TRAY LAPAROSCOPIC (CUSTOM PROCEDURE TRAY) ×2 IMPLANT
TROCAR BLADELESS OPT 5 100 (ENDOMECHANICALS) IMPLANT
TROCAR XCEL 12X100 BLDLESS (ENDOMECHANICALS) ×2 IMPLANT
WATER STERILE IRR 1000ML POUR (IV SOLUTION) ×4 IMPLANT

## 2021-02-07 NOTE — Discharge Instructions (Signed)

## 2021-02-07 NOTE — Anesthesia Preprocedure Evaluation (Addendum)
Anesthesia Evaluation  Patient identified by MRN, date of birth, ID band Patient awake    Airway Mallampati: II       Dental no notable dental hx.    Pulmonary former smoker, PE (on Xarelto)   Pulmonary exam normal        Cardiovascular negative cardio ROS   Rhythm:Regular Rate:Normal     Neuro/Psych negative neurological ROS  negative psych ROS   GI/Hepatic negative GI ROS, Neg liver ROS,   Endo/Other  negative endocrine ROS  Renal/GU CRFRenal disease  negative genitourinary   Musculoskeletal negative musculoskeletal ROS (+)   Abdominal Normal abdominal exam  (+)   Peds  Hematology negative hematology ROS (+)   Anesthesia Other Findings   Reproductive/Obstetrics                             Anesthesia Physical Anesthesia Plan  ASA: 3  Anesthesia Plan: General   Post-op Pain Management:    Induction: Intravenous  PONV Risk Score and Plan: 2 and Ondansetron, Dexamethasone and Treatment may vary due to age or medical condition  Airway Management Planned: Mask and Oral ETT  Additional Equipment: None  Intra-op Plan:   Post-operative Plan: Extubation in OR  Informed Consent: I have reviewed the patients History and Physical, chart, labs and discussed the procedure including the risks, benefits and alternatives for the proposed anesthesia with the patient or authorized representative who has indicated his/her understanding and acceptance.     Dental advisory given  Plan Discussed with: CRNA  Anesthesia Plan Comments: (Lab Results      Component                Value               Date                      WBC                      5.3                 02/05/2021                HGB                      15.2                02/05/2021                HCT                      45.0                02/05/2021                MCV                      89.6                02/05/2021                 PLT                      268                 02/05/2021  Lab Results      Component                Value               Date                      NA                       140                 02/05/2021                K                        4.5                 02/05/2021                CO2                      28                  02/05/2021                GLUCOSE                  99                  02/05/2021                BUN                      14                  02/05/2021                CREATININE               0.65                02/05/2021                CALCIUM                  9.7                 02/05/2021                GFRNONAA                 >60                 02/05/2021          )        Anesthesia Quick Evaluation

## 2021-02-07 NOTE — Brief Op Note (Signed)
02/07/2021  3:07 PM  PATIENT:  Chris Howard  66 y.o. male  PRE-OPERATIVE DIAGNOSIS:  RIGHT RENAL MASS AND CYST  POST-OPERATIVE DIAGNOSIS:  RIGHT RENAL MASS AND CYST  PROCEDURE:  Procedure(s) with comments: XI ROBOTIC ASSITED PARTIAL NEPHRECTOMY AND CYST DECORTICATION (Right) - 3 HRS  SURGEON:  Surgeon(s) and Role:    Alexis Frock, MD - Primary  PHYSICIAN ASSISTANT:   ASSISTANTS: Debbrah Alar PA   ANESTHESIA:   local and general  EBL:  100 mL   BLOOD ADMINISTERED:none  DRAINS:  1 - JP to bulb;     LOCAL MEDICATIONS USED:  MARCAINE     SPECIMEN:  Source of Specimen:  1 - Partial Nephrectomy / Cyst Wall; 2 - Final Margin; 3 - Additional Kidney  DISPOSITION OF SPECIMEN:  PATHOLOGY  COUNTS:  YES  TOURNIQUET:  * No tourniquets in log *  DICTATION: .Other Dictation: Dictation Number [id number]  PLAN OF CARE: Admit to inpatient   PATIENT DISPOSITION:  PACU - hemodynamically stable.   Delay start of Pharmacological VTE agent (>24hrs) due to surgical blood loss or risk of bleeding: yes

## 2021-02-07 NOTE — Transfer of Care (Signed)
Immediate Anesthesia Transfer of Care Note  Patient: Chris Howard  Procedure(s) Performed: XI ROBOTIC ASSITED PARTIAL NEPHRECTOMY AND CYST DECORTICATION (Right: Abdomen)  Patient Location: PACU  Anesthesia Type:General  Level of Consciousness: awake, alert  and oriented  Airway & Oxygen Therapy: Patient Spontanous Breathing and Patient connected to face mask oxygen  Post-op Assessment: Report given to RN and Post -op Vital signs reviewed and stable  Post vital signs: Reviewed and stable  Last Vitals:  Vitals Value Taken Time  BP 146/88 02/07/21 1523  Temp    Pulse 84 02/07/21 1524  Resp 10 02/07/21 1524  SpO2 100 % 02/07/21 1524  Vitals shown include unvalidated device data.  Last Pain:  Vitals:   02/07/21 0957  TempSrc: Oral         Complications: No notable events documented.

## 2021-02-07 NOTE — Anesthesia Procedure Notes (Signed)
Procedure Name: Intubation Date/Time: 02/07/2021 1:26 PM Performed by: Gean Maidens, CRNA Pre-anesthesia Checklist: Patient identified, Emergency Drugs available, Suction available, Patient being monitored and Timeout performed Patient Re-evaluated:Patient Re-evaluated prior to induction Oxygen Delivery Method: Circle system utilized Preoxygenation: Pre-oxygenation with 100% oxygen Induction Type: IV induction Ventilation: Mask ventilation without difficulty Laryngoscope Size: Mac and 4 Grade View: Grade I Tube type: Oral Tube size: 7.5 mm Number of attempts: 1 Airway Equipment and Method: Stylet Placement Confirmation: ETT inserted through vocal cords under direct vision, positive ETCO2 and breath sounds checked- equal and bilateral Secured at: 23 cm Tube secured with: Tape Dental Injury: Teeth and Oropharynx as per pre-operative assessment

## 2021-02-07 NOTE — Anesthesia Postprocedure Evaluation (Signed)
Anesthesia Post Note  Patient: Chris Howard  Procedure(s) Performed: XI ROBOTIC ASSITED PARTIAL NEPHRECTOMY AND CYST DECORTICATION (Right: Abdomen)     Patient location during evaluation: PACU Anesthesia Type: General Level of consciousness: awake and alert Pain management: pain level controlled Vital Signs Assessment: post-procedure vital signs reviewed and stable Respiratory status: spontaneous breathing, nonlabored ventilation, respiratory function stable and patient connected to nasal cannula oxygen Cardiovascular status: blood pressure returned to baseline and stable Postop Assessment: no apparent nausea or vomiting Anesthetic complications: no   No notable events documented.  Last Vitals:  Vitals:   02/07/21 1700 02/07/21 1802  BP: 136/79 135/83  Pulse: 70 73  Resp: 17 16  Temp:  36.5 C  SpO2: 98% 99%    Last Pain:  Vitals:   02/07/21 1815  TempSrc:   PainSc: 5                  Staphanie Harbison P Valaria Kohut

## 2021-02-07 NOTE — H&P (Signed)
Chris Howard is an 66 y.o. male.    Chief Complaint: Pre-OP RIGHT Partial Nephrectomy / Cyst Decortication  HPI:   1 - Right Cystic Renal Cancer - 4.4 cm cystic-solid enhancing mass incidental on CT then dedicated MRI 09/2020. 1 artery (early branching) / 2 vein (upper accessory) right renovascular anatomy.   Recent Course:  12/2020 - CT stable Rt cystic mass, improved Rt lung PE / infarct area   2 - Bilateral Non-Complex Renal Cysts - Rt supper posterior 1.5cm and punctate Lt. posterior med renal cysts by MRI 09/2020 w/o overt complex features.   PMH sig for F5Leiden / PE (Xarelto, follows [facility], starte blood thinner 08/2020). NO ischemic CV disease. No chest/abd surgeries. His PCP is Marilynne Drivers PA.   Today "Chris Howard" is seen to proceed with RIGHT robotic partial nephrectomy / cyst decortication. He has held Xarelto as directed and lovenox bridged. NO SOB. Hgb 15.2, C19 screen negative.    Past Medical History:  Diagnosis Date   Chronic kidney disease     Past Surgical History:  Procedure Laterality Date   BACK SURGERY      No family history on file. Social History:  reports that he quit smoking about 36 years ago. His smoking use included cigarettes. He has a 7.50 pack-year smoking history. He has never used smokeless tobacco. He reports current alcohol use. He reports that he does not use drugs.  Allergies: No Known Allergies  No medications prior to admission.    Results for orders placed or performed during the hospital encounter of 02/05/21 (from the past 48 hour(s))  CBC per protocol     Status: None   Collection Time: 02/05/21 10:38 AM  Result Value Ref Range   WBC 5.3 4.0 - 10.5 K/uL   RBC 5.02 4.22 - 5.81 MIL/uL   Hemoglobin 15.2 13.0 - 17.0 g/dL   HCT 45.0 39.0 - 52.0 %   MCV 89.6 80.0 - 100.0 fL   MCH 30.3 26.0 - 34.0 pg   MCHC 33.8 30.0 - 36.0 g/dL   RDW 13.0 11.5 - 15.5 %   Platelets 268 150 - 400 K/uL   nRBC 0.0 0.0 - 0.2 %    Comment:  Performed at [facility], [place] [address] [place], [place] [address]  Type and screen [place]     Status: None   Collection Time: 02/05/21 10:38 AM  Result Value Ref Range   ABO/RH(D) A POS    Antibody Screen NEG    Sample Expiration 02/19/2021,2359    Extend sample reason      NO TRANSFUSIONS OR PREGNANCY IN THE PAST 3 MONTHS Performed at [facility], [place] [address] [place], [place] [address]   Basic Metabolic Panel     Status: None   Collection Time: 02/05/21 10:38 AM  Result Value Ref Range   Sodium 140 135 - 145 mmol/L   Potassium 4.5 3.5 - 5.1 mmol/L   Chloride 107 98 - 111 mmol/L   CO2 28 22 - 32 mmol/L   Glucose, Bld 99 70 - 99 mg/dL    Comment: Glucose reference range applies only to samples taken after fasting for at least 8 hours.   BUN 14 8 - 23 mg/dL   Creatinine, Ser 0.65 0.61 - 1.24 mg/dL   Calcium 9.7 8.9 - 10.3 mg/dL   GFR, Estimated >60 >60 mL/min    Comment: (NOTE) Calculated using the CKD-EPI Creatinine Equation (2021)  Anion gap 5 5 - 15    Comment: Performed at [facility], [place] [address], [place],  [address]   No results found.  Review of Systems  Constitutional:  Negative for chills and fever.  Respiratory:  Negative for shortness of breath.   All other systems reviewed and are negative.  There were no vitals taken for this visit. Physical Exam Vitals reviewed.  HENT:     Head: Normocephalic.  Eyes:     Pupils: Pupils are equal, round, and reactive to light.  Cardiovascular:     Rate and Rhythm: Normal rate.  Pulmonary:     Effort: Pulmonary effort is normal.  Abdominal:     General: Abdomen is flat.  Genitourinary:    Comments: No CVAT Musculoskeletal:        General: Normal range of motion.     Cervical back: Normal range of motion.  Neurological:     General: No focal deficit present.     Mental Status: He is alert.  Psychiatric:        Mood  and Affect: Mood normal.     Assessment/Plan  Proceed as planned with RIGHT robotic partial nephrectomy / cyst decortication. Risks, benefits, alternatives, expected peri-op course discussed previously and reiterated today. He understands that his bleeding / clotting risk is much higher than average. Will restart Xarelto post-op when Hgb stable / drain output acceptable, possibly heparin or lovenox temporarily before formal xarelto restart as is more long acting.   Alexis Frock, MD 02/07/2021, 6:22 AM

## 2021-02-08 ENCOUNTER — Other Ambulatory Visit (HOSPITAL_COMMUNITY): Payer: Self-pay

## 2021-02-08 ENCOUNTER — Encounter (HOSPITAL_COMMUNITY): Payer: Self-pay | Admitting: Urology

## 2021-02-08 LAB — BASIC METABOLIC PANEL
Anion gap: 6 (ref 5–15)
BUN: 13 mg/dL (ref 8–23)
CO2: 25 mmol/L (ref 22–32)
Calcium: 8.7 mg/dL — ABNORMAL LOW (ref 8.9–10.3)
Chloride: 101 mmol/L (ref 98–111)
Creatinine, Ser: 0.95 mg/dL (ref 0.61–1.24)
GFR, Estimated: >60 mL/min (ref >60)
Glucose, Bld: 154 mg/dL — ABNORMAL HIGH (ref 70–99)
Potassium: 4.1 mmol/L (ref 3.5–5.1)
Sodium: 132 mmol/L — ABNORMAL LOW (ref 135–145)

## 2021-02-08 LAB — CBC
HCT: 40.3 % (ref 39.0–52.0)
Hemoglobin: 13.7 g/dL (ref 13.0–17.0)
MCH: 30.2 pg (ref 26.0–34.0)
MCHC: 34 g/dL (ref 30.0–36.0)
MCV: 88.8 fL (ref 80.0–100.0)
Platelets: 217 10*3/uL (ref 150–400)
RBC: 4.54 MIL/uL (ref 4.22–5.81)
RDW: 12.9 % (ref 11.5–15.5)
WBC: 11.7 10*3/uL — ABNORMAL HIGH (ref 4.0–10.5)
nRBC: 0 % (ref 0.0–0.2)

## 2021-02-08 MED ORDER — HEPARIN SODIUM (PORCINE) 5000 UNIT/ML IJ SOLN
5000.0000 [IU] | Freq: Three times a day (TID) | INTRAMUSCULAR | Status: DC
  Administered 2021-02-08 – 2021-02-09 (×4): 5000 [IU] via SUBCUTANEOUS
  Filled 2021-02-08 (×4): qty 1

## 2021-02-08 NOTE — Progress Notes (Signed)
Urology Progress Note   1 Day Post-Op from robotic assisted right partial nephrectomy and cyst decortication.   Subjective: NAEON.  Vital signs are stable.  About 1 L of urine output.  About 100 cc of JP output.  Hemoglobin is stable.  Patient was on bedrest last evening.  Not passing gas yet but does have an appetite. Objective: Vital signs in last 24 hours: Temp:  [96.4 F (35.8 C)-98.2 F (36.8 C)] 97.8 F (36.6 C) (02/02 1107) Pulse Rate:  [59-86] 59 (02/02 1107) Resp:  [10-20] 20 (02/02 1057) BP: (101-147)/(68-88) 117/72 (02/02 1107) SpO2:  [92 %-100 %] 92 % (02/02 1107) Weight:  [87.5 kg] 87.5 kg (02/01 1816)  Intake/Output from previous day: 02/01 0701 - 02/02 0700 In: 3561.3 [P.O.:120; I.V.:3041.3; IV Piggyback:400] Out: 1155 [Urine:950; Drains:105; Blood:100] Intake/Output this shift: Total I/O In: 240 [P.O.:240] Out: 1020 [Urine:1000; Drains:20]  Physical Exam:  General: Alert and oriented CV: Regular rate Lungs: No increased work of breathing Abdomen: Soft, appropriately tender. Incisions c/d/i. JP SS GU: Foley in place draining clear yellow urine  Ext: NT, No erythema  Lab Results: Recent Labs    02/07/21 1530 02/08/21 0445  HGB 14.2 13.7  HCT 42.0 40.3   Recent Labs    02/07/21 1530 02/08/21 0445  NA 136 132*  K 4.1 4.1  CL 104 101  CO2 25 25  GLUCOSE 145* 154*  BUN 11 13  CREATININE 0.87 0.95  CALCIUM 8.9 8.7*    Studies/Results: No results found.  Assessment/Plan:  66 y.o. male past medical history of PE on Xarelto and right renal mass and right renal cysts s/p robot-assisted laparoscopic right partial nephrectomy and cyst decortication.  Overall doing well post-op.   -Start subcutaneous heparin today -Daily hemoglobin hematocrit -Regular diet - Stop IV fluids - Discontinue Foley catheter - Continue JP drain - Ambulate - If hemoglobin stable, plan to restart Xarelto tomorrow  Dispo: Likely home Saturday 02/11/21 once able to  restart xarelto and Hb stable   LOS: 1 day

## 2021-02-08 NOTE — Progress Notes (Signed)
End of shift  Pt had foley removed today and voided appropriately.  Received dilaudid one time and zofran once.  Pt's pain resolved as the day progressed and ginger ale and saltines helped the nausea resolve completely.  Pt ambulates in the hallway and the room with family.  Pt asked to have staff assist, but he is VERY independent.    Pt's dressing on the JP drain changed one time and output from the drain was 80cc this shift.   Anticipate pt discharge tomorrow.

## 2021-02-08 NOTE — Op Note (Signed)
Chris Howard, SAMES MEDICAL RECORD NO: [id number] ACCOUNT NO: [id number] DATE OF BIRTH: [birth year] FACILITY: [facility] LOCATION: [facility] PHYSICIAN: Alexis Frock, MD  Operative Report   DATE OF PROCEDURE: 02/07/2021  SURGEON:  Alexis Frock, MD  PREOPERATIVE DIAGNOSIS:  Right renal mass and cyst.  PROCEDURE PERFORMED: 1.  Robotic laparoscopic right partial nephrectomy. 2.  Laparoscopic right renal cyst decortication. 3.  Intraoperative ultrasound with interpretation.  ESTIMATED BLOOD LOSS:  100 mL.  ASSISTANT:  Debbrah Alar, PA DRAINS: 1.  Jackson-Pratt drain to bulb suction. 2.  Foley catheter to straight drain.  FINDINGS: 1.  Simple appearing cyst in the anterolateral aspect of the kidney. 2.  Solid vascular renal mass just inferior and lateral to the cyst. 3.  Single early branching artery, single vein, right renal vascular anatomy.  SPECIMENS: 1.  Right partial nephrectomy with cyst wall. 2.  Additional renal tissue. 3.  Final renal margin.  INDICATIONS:  The patient is a very pleasant 66 year old man with recent history of pulmonary embolism, who was found incidentally to have an enhancing right renal mass.  He was found to have on his workup of PE factor V Leiden syndrome.  He has been on  anticoagulation for greater than 6 months.  He has had dramatic reduction in his pulmonary embolism radiographically and asymptomatic clinically. He has had progression of his right renal mass on interval relatively close surveillance.  Options were  discussed including continued surveillance versus ablative therapies versus surgical extirpation with and without renal preservation and in consultation with his medical oncologist, we all agreed that right robotic partial nephrectomy would be most  prudent.  He does have a concomitant nearby cyst that is clearly felt to be decorticated as well to rule out neoplasm.  He has been off of his Xarelto with Lovenox bridge as directed, no anticoagulation  24 hours. He presents for partial nephrectomy  today.  Informed consent was obtained and placed on the medical record.  PROCEDURE IN DETAIL:  The patient being verified and the procedure being right robotic partial nephrectomy, and renal cyst decortication was confirmed.  Procedure timeout was performed.  Intravenous antibiotics were administered.  General endotracheal  anesthesia induced.  Foley catheter was placed.  The patient was placed in the right side up a full flank position and pulling 15 degrees of the table flexion, superior arm elevator, axillary roll, sequential compression devices, bottom leg bent, top leg  straight.  He was further fastened on the operating table using 3-inch tape over foam padding across the supra-xiphoid chest and his pelvis.  Sterile field was created, prepped and draped the patient's right flank and abdomen using chlorhexidine  gluconate after clipper shaving and a high flow, low pressure, pneumoperitoneum was obtained using Veress technique in the right lower quadrant having passed the aspiration drop test.  An 8 mm right robotic camera port was then placed in position  approximately 1 handbreadth superolateral to the umbilicus.  Laparoscopic examination of peritoneal cavity revealed no significant adhesions, no visceral injury.  Additional ports were placed as follows:  A right subcostal 8 mm robotic port, right far  lateral 8 mm robotic port approximately 4 fingerbreadths superior medial to the anterior iliac spine, right paramedian inferior robotic port approximately 1 handbreadth superior to the pubic ramus and two 12 mm assistant port sites in the midline, one in  the supraumbilical crease and another approximately 2 fingerbreadths superior to the plane of the camera port and a 5 mm port in the subxiphoid location  through which a self-locking laparoscopic grasper was used to elevate the liver off the anterior  surface of Gerota's fascia, thus providing liver  retraction.  The robot was docked and passed electronic checks.  Attention was directed at development of the right retroperitoneum.  Incision was made lateral to the ascending colon near the cecum towards  near the hepatic flexure and the colon was carefully mobilized medially.  The duodenum was encountered and kocherized medially such that it lie medial to the lateral border of the inferior vena cava. The lower pole of the kidney area was identified and  placed on gentle lateral traction.  Dissection proceeded medial to this.  The ureter was encountered and also placed on gentle lateral traction.  Gonadal vessels were encountered and allowed to fall medially.  Dissection proceeded within this triangle  towards the renal hilum.  The renal hilum consistent with a single early branching artery, single vein, right renal vascular anatomy as anticipated and the artery was circumferentially mobilized vessel loop for later warm ischemia.  Very careful review of the  preoperative imaging revealed the mass to be in a superolateral location superior to the hilar axis.  Dissection proceeded directly onto the anterolateral renal parenchyma, then laterally towards the area of the mass. The mass and cyst were encountered  as expected and circumferentially mobilized to keep a bucket handle of fat.  With this, the mass plus cyst together was a significant volume, probably estimated at 5-6 cm.  It was felt after very careful review of the imaging and gross inspection most  prudent means of measure would be to decorticate the cyst first, drainage of fluid, thus, this would allow much more accurate gross delineation of where the renal parenchyma tumor interface was.  Intraoperative ultrasound was then performed.  Intraoperative ultrasound revealed  the relatively simple-appearing cyst anterior to the solid appearing mass.  The cyst was quite simple and so the decortication technique alone would be a sufficient management.  As  such, the cyst was purposely  incised, drained of simple straw-colored fluid and cyst wall excised, flushed with the renal parenchyma towards the area of the tumor interface.  Next, warm ischemia was achieved by placing two bulldog clamps on the renal artery and partial nephrectomy  was performed of the area of the mass given what appeared to be a rim of normal renal parenchyma with the mass specimen, this was set aside and placed in EndoCatch bag for later retrieval.  Careful inspection of the renorrhaphy area did reveal one  questionable area of residual small tumor versus cautery artifact in the superior portion.  This was further excised, set aside as an additional tissue and a final margin sent from this. First layer renorrhaphy was performed using running 3-0 V-Loc  suture oversewing several small venous sinuses.  There was no obvious collecting system entrance. Second layer renorrhaphy was performed by placing a bulldog along the long axis and then three parenchymal apposition sutures of Vicryl sandwiched between  Hem-o-loks and Lapra-Tys resulted in excellent parenchymal apposition.  Bulldog clamps were removed.  Excellent hemostasis was noted.  Thus, loop was then removed.  Retroperitoneum was reapproximated using 2-0 V-Loc reapproximating the retroperitoneum  over the kidney for hemostasis after FloSeal was applied.  We achieved the goals of extirpated portion of the surgery today.  Sponge and needle counts were correct.  Robot was undocked.  Superior most assistant port site was closed at the level of the  fascia using a Carter-Thomason suture passer.  The specimen was retrieved by extending the previous inferior assistant port site superiorly, which was approximately 4 cm, removing the kidney plus cyst wall.  Specimen set aside for permanent pathology.   The extraction site was closed at the fascia using a figure-of-eight PDS x3 followed by reapproximation of Scarpa's with running Vicryl.  All  incision sites were infiltrated with dilute lipolyzed Marcaine and then closed at the level of the skin using  subcuticular Monocryl followed by Dermabond.  The procedure was terminated.  The patient tolerated the procedure well.  There were no immediate complications.  The patient taken to postanesthesia care in stable condition.  Plan for inpatient admission.  He will be in-house, observed carefully for hemostasis, bridged to heparin and then bridged to his oral anticoagulant pending his hemodynamic stability.  Please note, first assistant Debbrah Alar was crucial for all portions of the surgery today.  She provided invaluable retraction, suctioning, vascular clamping, specimen manipulation, ultrasound manipulation and general first assistance.   NIK D: 02/07/2021 3:17:45 pm T: 02/08/2021 12:50:00 am  JOB: [id number]/ [id number]

## 2021-02-08 NOTE — TOC Initial Note (Signed)
Transition of Care [facility]) - Initial/Assessment Note    Patient Details  Name: Muneeb Veras MRN: [id number] Date of Birth: 20-Dec-1955  Transition of Care [facility]) CM/SW Contact:    Leeroy Cha, RN Phone Number: 02/08/2021, 9:27 AM  Clinical Narrative:                  Transition of Care [facility]) Screening Note   Patient Details  Name: Sherrill Mckamie Date of Birth: 1955-01-30   Transition of Care [facility]) CM/SW Contact:    Leeroy Cha, RN Phone Number: 02/08/2021, 9:27 AM    Transition of Care Department [facility]) has reviewed patient and no TOC needs have been identified at this time. We will continue to monitor patient advancement through interdisciplinary progression rounds. If new patient transition needs arise, please place a TOC consult.    Expected Discharge Plan: Home/Self Care Barriers to Discharge: Continued Medical Work up   Patient Goals and CMS Choice Patient states their goals for this hospitalization and ongoing recovery are:: to go back home CMS Medicare.gov Compare Post Acute Care list provided to:: Patient    Expected Discharge Plan and Services Expected Discharge Plan: Home/Self Care   Discharge Planning Services: CM Consult   Living arrangements for the past 2 months: Single Family Home                                      Prior Living Arrangements/Services Living arrangements for the past 2 months: Single Family Home Lives with:: Spouse Patient language and need for interpreter reviewed:: Yes Do you feel safe going back to the place where you live?: Yes            Criminal Activity/Legal Involvement Pertinent to Current Situation/Hospitalization: No - Comment as needed  Activities of Daily Living Home Assistive Devices/Equipment: Eyeglasses, Hearing aid ADL Screening (condition at time of admission) Patient's cognitive ability adequate to safely complete daily activities?: Yes Is the patient deaf or have difficulty hearing?: Yes Does  the patient have difficulty seeing, even when wearing glasses/contacts?: No Does the patient have difficulty concentrating, remembering, or making decisions?: No Patient able to express need for assistance with ADLs?: Yes Does the patient have difficulty dressing or bathing?: No Independently performs ADLs?: Yes (appropriate for developmental age) Does the patient have difficulty walking or climbing stairs?: No Weakness of Legs: None Weakness of Arms/Hands: None  Permission Sought/Granted                  Emotional Assessment Appearance:: Appears stated age Attitude/Demeanor/Rapport: Engaged Affect (typically observed): Calm Orientation: : Oriented to Place, Oriented to Self, Oriented to  Time, Oriented to Situation Alcohol / Substance Use: Not Applicable Psych Involvement: No (comment)  Admission diagnosis:  Renal mass [N28.89] Patient Active Problem List   Diagnosis Date Noted   Renal mass 02/07/2021   Chronic pulmonary embolism ([place]) 09/12/2020   Lumbar disc herniation 06/27/2016   Low back pain radiating to left leg 06/10/2016   Injury of calf 04/24/2016   Sacro-iliac pain 04/28/2007   PCP:  Lois Huxley, PA Pharmacy:   [facility] [address] [place] [address] Phone: [phone] Fax: [phone]     Social Determinants of Health (SDOH) Interventions    Readmission Risk Interventions No flowsheet data found.

## 2021-02-09 ENCOUNTER — Other Ambulatory Visit (HOSPITAL_COMMUNITY): Payer: Self-pay

## 2021-02-09 LAB — SURGICAL PATHOLOGY

## 2021-02-09 LAB — HEMOGLOBIN AND HEMATOCRIT, BLOOD
HCT: 38.4 % — ABNORMAL LOW (ref 39.0–52.0)
Hemoglobin: 12.9 g/dL — ABNORMAL LOW (ref 13.0–17.0)

## 2021-02-09 MED ORDER — RIVAROXABAN 20 MG PO TABS
20.0000 mg | ORAL_TABLET | Freq: Every day | ORAL | Status: DC
  Administered 2021-02-09: 20 mg via ORAL
  Filled 2021-02-09: qty 1

## 2021-02-09 NOTE — Progress Notes (Signed)
Urology Progress Note   2 Days Post-Op from robotic assisted right partial nephrectomy and cyst decortication.   Subjective: NAEON.  Vital signs are stable.  Voiding spontaneously.  Tolerating p.o. diet.  Has not passed gas yet.  Objective: Vital signs in last 24 hours: Temp:  [97.5 F (36.4 C)-98.2 F (36.8 C)] 98.2 F (36.8 C) (02/03 0606) Pulse Rate:  [66-67] 66 (02/03 0606) Resp:  [18-20] 18 (02/03 0606) BP: (106-128)/(64-74) 128/70 (02/03 0606) SpO2:  [96 %-98 %] 97 % (02/03 0606)  Intake/Output from previous day: 02/02 0701 - 02/03 0700 In: 38 [P.O.:360; IV Piggyback:100] Out: 1080 [Urine:1000; Drains:80] Intake/Output this shift: Total I/O In: 240 [P.O.:240] Out: -   Physical Exam:  General: Alert and oriented CV: Regular rate Lungs: No increased work of breathing Abdomen: Soft, appropriately tender. Incisions c/d/i. JP SS GU: Voiding spontaneously Ext: NT, No erythema  Lab Results: Recent Labs    02/07/21 1530 02/08/21 0445 02/09/21 0432  HGB 14.2 13.7 12.9*  HCT 42.0 40.3 38.4*    Recent Labs    02/07/21 1530 02/08/21 0445  NA 136 132*  K 4.1 4.1  CL 104 101  CO2 25 25  GLUCOSE 145* 154*  BUN 11 13  CREATININE 0.87 0.95  CALCIUM 8.9 8.7*     Studies/Results: No results found.  Assessment/Plan:  66 y.o. male past medical history of PE on Xarelto and right renal mass and right renal cysts s/p robot-assisted laparoscopic right partial nephrectomy and cyst decortication.  Overall doing well post-op.   -Stop subcutaneous heparin and start home Xarelto today -Daily hemoglobin hematocrit -Regular diet - Continue JP drain for now - Ambulate - If hemoglobin stable, plan to remove JP drain and discharge home tomorrow  Dispo: Likely home Saturday 02/11/21 once able to restart xarelto and Hb stable   LOS: 2 days

## 2021-02-10 ENCOUNTER — Other Ambulatory Visit (HOSPITAL_COMMUNITY): Payer: Self-pay

## 2021-02-10 LAB — HEMOGLOBIN AND HEMATOCRIT, BLOOD
HCT: 38.7 % — ABNORMAL LOW (ref 39.0–52.0)
Hemoglobin: 13.1 g/dL (ref 13.0–17.0)

## 2021-02-10 MED ORDER — TRAMADOL HCL 50 MG PO TABS
50.0000 mg | ORAL_TABLET | Freq: Four times a day (QID) | ORAL | 0 refills | Status: DC | PRN
  Filled 2021-02-10: qty 10, 3d supply, fill #0

## 2021-02-10 NOTE — Plan of Care (Signed)
°  Problem: Education: Goal: Knowledge of General Education information will improve Description: Including pain rating scale, medication(s)/side effects and non-pharmacologic comfort measures Outcome: Progressing   Problem: Clinical Measurements: Goal: Diagnostic test results will improve Outcome: Progressing   Problem: Activity: Goal: Risk for activity intolerance will decrease Outcome: Progressing   Problem: Nutrition: Goal: Adequate nutrition will be maintained Outcome: Progressing   Problem: Elimination: Goal: Will not experience complications related to bowel motility Outcome: Progressing Goal: Will not experience complications related to urinary retention Outcome: Progressing

## 2021-02-10 NOTE — Plan of Care (Signed)
  Problem: Education: Goal: Knowledge of General Education information will improve Description: Including pain rating scale, medication(s)/side effects and non-pharmacologic comfort measures Outcome: Progressing   Problem: Health Behavior/Discharge Planning: Goal: Ability to manage health-related needs will improve Outcome: Progressing   Problem: Clinical Measurements: Goal: Diagnostic test results will improve Outcome: Progressing   

## 2021-02-10 NOTE — Discharge Summary (Signed)
Physician Discharge Summary  Patient ID: Chris Howard MRN: [id number] DOB/AGE: [birth year] 66 y.o.  Admit date: 02/07/2021 Discharge date: 02/10/2021  Admission Diagnoses: Right renal mass  Discharge Diagnoses:  Principal Problem:   Renal mass   Discharged Condition: good  Hospital Course: Chris Howard was admitted following a right partial nephrectomy on February 07, 2021.  He was monitored and his Xarelto was restarted on February third 2023.  He has done well with stable hemoglobin with a slight increase from 12.9 yesterday to 13.1 today.  His creatinine was normal postop at 0.95.  He is doing well ambulating and passing gas.  He is tolerating a regular diet and has good pain control.  He was discharged on postop day 3.  JP was removed prior to discharge.  Patient requested a few tramadol and we discussed the nature risk benefits and alternatives to it.   Consults:  none  Significant Diagnostic Studies: none  Treatments: surgery: Right partial nephrectomy February 07, 2021  Discharge Exam: Blood pressure 125/69, pulse 69, temperature 97.9 F (36.6 C), temperature source Oral, resp. rate 18, height 5\' 10"  (1.778 m), weight 87.5 kg, SpO2 98 %. He looks well, up and down out of the chair No acute distress Cardiovascular-regular rate and rhythm Respiratory-regular effort and depth Abdomen-soft and nontender, incisions are clean dry and intact, JP with a minimal amount of serous sanguinous Extremity-no calf pain or swelling    Disposition: Discharge disposition: 01-Home or Self Care        Allergies as of 02/10/2021   No Known Allergies      Medication List     STOP taking these medications    enoxaparin 80 MG/0.8ML injection Commonly known as: Lovenox       TAKE these medications    docusate sodium 100 MG capsule Commonly known as: COLACE Take 1 capsule (100 mg total) by mouth 2 (two) times daily.   HYDROcodone-acetaminophen 5-325 MG tablet Commonly known as:  Norco Take 1-2 tablets by mouth every 6 (six) hours as needed for moderate pain or severe pain.   OMEGA 3 PO Take 1 tablet by mouth daily.   traMADol 50 MG tablet Commonly known as: ULTRAM Take 1 tablet (50 mg total) by mouth every 6 (six) hours as needed.   Xarelto 20 MG Tabs tablet Generic drug: rivaroxaban Take 1 tablet by mouth daily with supper.        Follow-up Information     Alexis Frock, MD Follow up on 02/26/2021.   Specialty: Urology Why: for MD visit. Contact information: [place] [address] [phone]                 Signed: Festus Aloe 02/10/2021, 2:44 PM

## 2021-02-10 NOTE — Progress Notes (Signed)
AVS and discharge instructions reviewed w/ patient, wife and daughter at the bedside. Patient and family verbalized understanding and had no further questions.

## 2021-02-12 ENCOUNTER — Other Ambulatory Visit (HOSPITAL_COMMUNITY): Payer: Self-pay

## 2021-02-23 ENCOUNTER — Other Ambulatory Visit (HOSPITAL_COMMUNITY): Payer: Self-pay

## 2021-03-09 ENCOUNTER — Other Ambulatory Visit (HOSPITAL_COMMUNITY): Payer: Self-pay

## 2021-03-26 ENCOUNTER — Other Ambulatory Visit (HOSPITAL_COMMUNITY): Payer: Self-pay

## 2021-04-02 ENCOUNTER — Telehealth: Payer: Self-pay | Admitting: *Deleted

## 2021-04-02 NOTE — Telephone Encounter (Signed)
Received call from pt requesting telephone visit with MD on 04/23/21.  Pt states his daughter will be in town and has several question regarding pt care and would like to speak with MD.  Appt scheduled, pt verbalized understanding of appt date and time.  ?

## 2021-04-12 NOTE — Progress Notes (Signed)
HEMATOLOGY-ONCOLOGY TELEPHONE VISIT PROGRESS NOTE ? ?I connected with Chris Howard on 04/23/21 at 12:00 PM EDT by telephone and verified that I am speaking with the correct person using two identifiers.  ?I discussed the limitations, risks, security and privacy concerns of performing an evaluation and management service by telephone and the availability of in person appointments.  ?I also discussed with the patient that there may be a patient responsible charge related to this service. The patient expressed understanding and agreed to proceed.  ? ?History of Present Illness: Chris Howard is a 66 y.o. male with history of chronic pulmonary embolism. He presents via telephone today to discuss his anticoagulation treatment plan. ? ?REVIEW OF SYSTEMS:   ?Constitutional: Denies fevers, chills or abnormal weight loss ?All other systems were reviewed with the patient and are negative. ? ?Assessment Plan:  ?Clear cell carcinoma of right kidney (Chris Howard) ?02/07/2021: Right partial nephrectomy: Dr. Junious Silk: 02/07/2021: Clear-cell renal cell cancer with cystic changes nuclear grade 2, 3 cm, limited to the kidney T1a, margins negative ?No further role of adjuvant treatment. ? ?Chronic pulmonary embolism ([place]) ?CT angiogram 08/25/2020: Chronic right pulmonary artery PE with recanalization, no heart strain possible right lung infarct right upper lobe branches, unusual appearance of soft tissue attenuation in the inferior right hilum (felt to be segmental and subsegmental PE with recannulization), right hilar lymph node ?2 cm enhancement behind the 3.4 cm cystic lesion ?  ?Lab review: ?09/12/2020: Lupus anticoagulant positive (repeat testing 12/18/2020: Persistent positivity for lupus anticoagulant) ?Factor V Leiden and prothrombin gene mutations: Negative ?Protein S: 95%, protein C: 114%, anticardiolipin antibodies: Negative, Antithrombin III: 105% ?  ?Current treatment: Xarelto, switching to Eliquis (based on patient preference as well as some data  showing lower incidence of bleeding episodes ?Tolerating Xarelto extremely well.  Recommendation is lifelong anticoagulation ? ?Neck pain ?01/15/2021: CT neck: Indeterminate small metallic radiopaque foreign body near the left first rib.  No mass or adenopathy.  Cervical spine spondylosis. ?Referred patient to orthopedics. Exercises have helped get better (stopped running) ? ?Return to clinic once a year for follow-up with a telephone visit. ? ?I discussed the assessment and treatment plan with the patient. The patient was provided an opportunity to ask questions and all were answered. The patient agreed with the plan and demonstrated an understanding of the instructions. The patient was advised to call back or seek an in-person evaluation if the symptoms worsen or if the condition fails to improve as anticipated.  ? ?I provided 15 minutes of non-face-to-face time during this encounter. Harriette Ohara, MD   ?Earlie Server am scribing for Dr. Lindi Adie ? ?I have reviewed the above documentation for accuracy and completeness, and I agree with the above. ?  ?

## 2021-04-23 ENCOUNTER — Inpatient Hospital Stay: Payer: Medicare Other | Attending: Hematology and Oncology | Admitting: Hematology and Oncology

## 2021-04-23 ENCOUNTER — Other Ambulatory Visit (HOSPITAL_COMMUNITY): Payer: Self-pay

## 2021-04-23 DIAGNOSIS — I2782 Chronic pulmonary embolism: Secondary | ICD-10-CM

## 2021-04-23 DIAGNOSIS — C641 Malignant neoplasm of right kidney, except renal pelvis: Secondary | ICD-10-CM

## 2021-04-23 DIAGNOSIS — M542 Cervicalgia: Secondary | ICD-10-CM | POA: Diagnosis not present

## 2021-04-23 MED ORDER — APIXABAN 5 MG PO TABS
5.0000 mg | ORAL_TABLET | Freq: Two times a day (BID) | ORAL | 11 refills | Status: DC
  Filled 2021-04-23 – 2021-05-02 (×2): qty 60, 30d supply, fill #0
  Filled 2021-06-18: qty 60, 30d supply, fill #1
  Filled 2021-07-16: qty 60, 30d supply, fill #2
  Filled 2021-08-15: qty 60, 30d supply, fill #3
  Filled 2021-09-12: qty 60, 30d supply, fill #4
  Filled 2021-10-17: qty 60, 30d supply, fill #5
  Filled 2021-11-16: qty 60, 30d supply, fill #6
  Filled 2021-12-17: qty 60, 30d supply, fill #7
  Filled 2022-01-23: qty 60, 30d supply, fill #8
  Filled 2022-02-14 – 2022-02-15 (×3): qty 60, 30d supply, fill #9
  Filled 2022-03-21: qty 60, 30d supply, fill #10
  Filled 2022-04-18: qty 60, 30d supply, fill #11

## 2021-04-23 NOTE — Assessment & Plan Note (Signed)
CT angiogram 08/25/2020: Chronic right pulmonary artery PE with recanalization, no heart strain possible right lung infarct?right upper lobe branches, unusual appearance of soft tissue attenuation in the inferior right hilum (felt to be segmental and subsegmental PE with recannulization), right hilar lymph node ?2 cm enhancement behind the 3.4 cm cystic lesion ?? ?Lab review: ?09/12/2020: Lupus anticoagulant positive (repeat testing 12/18/2020: Persistent positivity for lupus anticoagulant) ?Factor V Leiden and prothrombin gene mutations: Negative ?Protein S: 95%, protein C: 114%, anticardiolipin antibodies: Negative, Antithrombin III: 105% ?? ?Current treatment: Xarelto ?Tolerating Xarelto extremely well.  Recommendation is lifelong anticoagulation ?

## 2021-04-23 NOTE — Assessment & Plan Note (Addendum)
02/07/2021: Right partial nephrectomy: Dr. Eskridge: 02/07/2021: Clear-cell renal cell cancer with cystic changes nuclear grade 2, 3 cm, limited to the kidney T1a, margins negative No further role of adjuvant treatment. 

## 2021-04-23 NOTE — Assessment & Plan Note (Signed)
01/15/2021: CT neck: Indeterminate small metallic radiopaque foreign body near the left first rib.  No mass or adenopathy.  Cervical spine spondylosis. ?Referred patient to orthopedics. ?

## 2021-05-02 ENCOUNTER — Other Ambulatory Visit (HOSPITAL_COMMUNITY): Payer: Self-pay

## 2021-06-18 ENCOUNTER — Other Ambulatory Visit (HOSPITAL_COMMUNITY): Payer: Self-pay

## 2021-07-16 ENCOUNTER — Other Ambulatory Visit (HOSPITAL_COMMUNITY): Payer: Self-pay

## 2021-08-15 ENCOUNTER — Other Ambulatory Visit (HOSPITAL_COMMUNITY): Payer: Self-pay

## 2021-08-22 ENCOUNTER — Ambulatory Visit (HOSPITAL_COMMUNITY)
Admission: RE | Admit: 2021-08-22 | Discharge: 2021-08-22 | Disposition: A | Discharge status: Home / Self Care | Payer: Medicare Other | Source: Ambulatory Visit | Attending: Anesthesiology | Admitting: Anesthesiology

## 2021-08-22 ENCOUNTER — Other Ambulatory Visit (HOSPITAL_COMMUNITY): Payer: Self-pay | Admitting: Anesthesiology

## 2021-08-22 DIAGNOSIS — C641 Malignant neoplasm of right kidney, except renal pelvis: Secondary | ICD-10-CM | POA: Insufficient documentation

## 2021-09-12 ENCOUNTER — Other Ambulatory Visit (HOSPITAL_COMMUNITY): Payer: Self-pay

## 2021-10-17 ENCOUNTER — Other Ambulatory Visit (HOSPITAL_COMMUNITY): Payer: Self-pay

## 2021-11-16 ENCOUNTER — Other Ambulatory Visit (HOSPITAL_COMMUNITY): Payer: Self-pay

## 2021-12-17 ENCOUNTER — Other Ambulatory Visit (HOSPITAL_COMMUNITY): Payer: Self-pay

## 2021-12-20 NOTE — Progress Notes (Signed)
Patient Care Team: Lois Huxley, PA as PCP - General (Family Medicine)  DIAGNOSIS:  Encounter Diagnoses  Name Primary?   Chronic pulmonary embolism without acute cor pulmonale, unspecified pulmonary embolism type (HCC) Yes   Clear cell carcinoma of right kidney (HCC)     CHIEF COMPLIANT:  Follow-up chronic pulmonary embolism  INTERVAL HISTORY: Chris Howard is a 66 y.o. male with history of chronic pulmonary embolism. He presents to the clinic for labs and a follow-up. He states that his neck and shoulders has got better. He had started sitting up straight. He states things has subsided from that. He says he can run again now. He ran 5 miles yesterday and no pain in his neck.[name] He reports breathing is good. He denies no trouble taking the eliqius. Overall he says he feel great.   ALLERGIES:  has No Known Allergies.  MEDICATIONS:  Current Outpatient Medications  Medication Sig Dispense Refill   apixaban (ELIQUIS) 5 MG TABS tablet Take 1 tablet (5 mg total) by mouth 2 (two) times daily. 60 tablet 11   No current facility-administered medications for this visit.   Facility-Administered Medications Ordered in Other Visits  Medication Dose Route Frequency Provider Last Rate Last Admin   polyethylene glycol powder (GLYCOLAX/MIRALAX) container 255 g  1 Container Oral Once Alexis Frock, MD        PHYSICAL EXAMINATION: ECOG PERFORMANCE STATUS: 1 - Symptomatic but completely ambulatory  Vitals:   12/21/21 1132  BP: 138/65  Pulse: 66  Resp: 18  Temp: (!) 97.5 F (36.4 C)  SpO2: 100%   Filed Weights   12/21/21 1132  Weight: 203 lb 3.2 oz (92.2 kg)      LABORATORY DATA:  I have reviewed the data as listed    Latest Ref Rng & Units 02/08/2021    4:45 AM 02/07/2021    3:30 PM 02/05/2021   10:38 AM  CMP  Glucose 70 - 99 mg/dL 154  145  99   BUN 8 - 23 mg/dL '13  11  14   '$ Creatinine 0.61 - 1.24 mg/dL 0.95  0.87  0.65   Sodium 135 - 145 mmol/L 132  136  140   Potassium 3.5 -  5.1 mmol/L 4.1  4.1  4.5   Chloride 98 - 111 mmol/L 101  104  107   CO2 22 - 32 mmol/L '25  25  28   '$ Calcium 8.9 - 10.3 mg/dL 8.7  8.9  9.7     Lab Results  Component Value Date   WBC 11.7 (H) 02/08/2021   HGB 13.1 02/10/2021   HCT 38.7 (L) 02/10/2021   MCV 88.8 02/08/2021   PLT 217 02/08/2021    ASSESSMENT & PLAN:  Chronic pulmonary embolism (HCC) CT angiogram 08/25/2020: Chronic right pulmonary artery PE with recanalization, no heart strain possible right lung infarct right upper lobe branches, unusual appearance of soft tissue attenuation in the inferior right hilum (felt to be segmental and subsegmental PE with recannulization), right hilar lymph node 2 cm enhancement behind the 3.4 cm cystic lesion   Lab review: 09/12/2020: Lupus anticoagulant positive (repeat testing 12/18/2020: Persistent positivity for lupus anticoagulant) Factor V Leiden and prothrombin gene mutations: Negative Protein S: 95%, protein C: 114%, anticardiolipin antibodies: Negative, Antithrombin III: 105% ------------------------------------------------------------------------------------------------------------------------------------------------------  Current treatment: Xarelto, switched to Eliquis on 04/23/2021 (based on patient preference as well as some data showing lower incidence of bleeding episodes Recommendation is lifelong anticoagulation   Neck pain 01/15/2021: CT neck: Indeterminate  small metallic radiopaque foreign body near the left first rib.  No mass or adenopathy.  Cervical spine spondylosis. Referred patient to orthopedics. Exercises have helped get better (stopped running)   Return to clinic once a year  Clear cell carcinoma of right kidney ([place]) 02/07/2021: Right partial nephrectomy: Dr. Junious Silk: 02/07/2021: Clear-cell renal cell cancer with cystic changes nuclear grade 2, 3 cm, limited to the kidney T1a, margins negative No further role of adjuvant treatment.    No orders of the defined  types were placed in this encounter.  The patient has a good understanding of the overall plan. he agrees with it. he will call with any problems that may develop before the next visit here. Total time spent: 30 mins including face to face time and time spent for planning, charting and co-ordination of care   Harriette Ohara, MD 12/21/21    I Gardiner Coins am acting as a Education administrator for [employer]  I have reviewed the above documentation for accuracy and completeness, and I agree with the above.

## 2021-12-21 ENCOUNTER — Inpatient Hospital Stay: Payer: Medicare Other | Attending: Hematology and Oncology | Admitting: Hematology and Oncology

## 2021-12-21 VITALS — BP 138/65 | HR 66 | Temp 97.5°F | Resp 18 | Ht 70.0 in | Wt 203.2 lb

## 2021-12-21 DIAGNOSIS — Z79899 Other long term (current) drug therapy: Secondary | ICD-10-CM | POA: Insufficient documentation

## 2021-12-21 DIAGNOSIS — M542 Cervicalgia: Secondary | ICD-10-CM | POA: Insufficient documentation

## 2021-12-21 DIAGNOSIS — I2699 Other pulmonary embolism without acute cor pulmonale: Secondary | ICD-10-CM | POA: Insufficient documentation

## 2021-12-21 DIAGNOSIS — D6862 Lupus anticoagulant syndrome: Secondary | ICD-10-CM | POA: Diagnosis not present

## 2021-12-21 DIAGNOSIS — C641 Malignant neoplasm of right kidney, except renal pelvis: Secondary | ICD-10-CM | POA: Diagnosis present

## 2021-12-21 DIAGNOSIS — I2782 Chronic pulmonary embolism: Secondary | ICD-10-CM

## 2021-12-21 NOTE — Assessment & Plan Note (Signed)
02/07/2021: Right partial nephrectomy: Dr. Junious Silk: 02/07/2021: Clear-cell renal cell cancer with cystic changes nuclear grade 2, 3 cm, limited to the kidney T1a, margins negative No further role of adjuvant treatment.

## 2021-12-21 NOTE — Assessment & Plan Note (Addendum)
CT angiogram 08/25/2020: Chronic right pulmonary artery PE with recanalization, no heart strain possible right lung infarct right upper lobe branches, unusual appearance of soft tissue attenuation in the inferior right hilum (felt to be segmental and subsegmental PE with recannulization), right hilar lymph node 2 cm enhancement behind the 3.4 cm cystic lesion   Lab review: 09/12/2020: Lupus anticoagulant positive (repeat testing 12/18/2020: Persistent positivity for lupus anticoagulant) Factor V Leiden and prothrombin gene mutations: Negative Protein S: 95%, protein C: 114%, anticardiolipin antibodies: Negative, Antithrombin III: 105% ------------------------------------------------------------------------------------------------------------------------------------------------------  Current treatment: Xarelto, switched to Eliquis on 04/23/2021 (based on patient preference as well as some data showing lower incidence of bleeding episodes Recommendation is lifelong anticoagulation   Neck pain 01/15/2021: CT neck: Indeterminate small metallic radiopaque foreign body near the left first rib.  No mass or adenopathy.  Cervical spine spondylosis. Referred patient to orthopedics. Exercises have helped get better (stopped running)   Return to clinic once a year

## 2021-12-24 ENCOUNTER — Telehealth: Payer: Self-pay | Admitting: Hematology and Oncology

## 2021-12-24 NOTE — Telephone Encounter (Signed)
Scheduled appointment per 12/15 los. Left voicemail.

## 2022-01-05 IMAGING — MR MR ABDOMEN WO/W CM
20 series · 48 of 48 positions shown · IV contrast (multihance)
Comparison: CT August 24, 2020

CLINICAL DATA: Further evaluation of partially visualized right
renal lesion from prior CT chest.

EXAM:
MRI ABDOMEN WITHOUT AND WITH CONTRAST
TECHNIQUE: Multiplanar multisequence MR imaging of the abdomen was performed
both before and after the administration of intravenous contrast.
CONTRAST:  18mL MULTIHANCE GADOBENATE DIMEGLUMINE 529 MG/ML IV SOLN

[Series 3: T2 · coronal · 5.0mm · 0.74mm/px · 1 of 19 slices shown (1 of 3)]
[im 1/19]
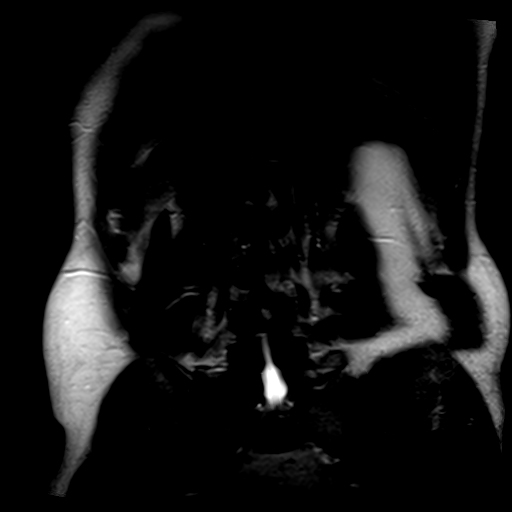

[Series 4: T2 · axial · 5.0mm · 0.68mm/px · 1 of 27 slices shown (2 of 3)]
[im 1/27]
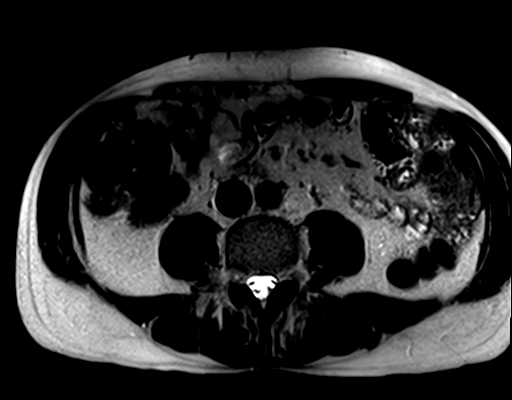

[Series 5: ep2d_[id number]_500_[id number]_trig · axial · 5.0mm · 1.82mm/px · z∈[-66,+90]mm · 3 of 81 slices shown]
[im 1/81]
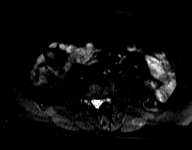
[im 41/81]
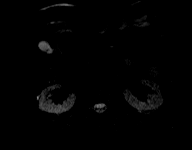
[im 81/81]
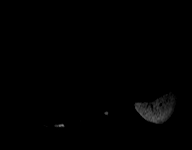

[Series 6: [id number]_trig_adc · axial · 5.0mm · 1.82mm/px · 1 of 27 slices shown]
[im 1/27]
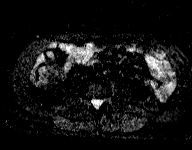

[Series 7: T2 · axial · 5.0mm · 1.37mm/px · 1 of 27 slices shown (3 of 3)]
[im 1/27]
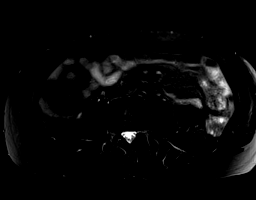

[Series 8: bSSFP · axial · 4.0mm · 0.68mm/px · z∈[-77,+91]mm · 2 of 44 slices shown]
[im 1/44]
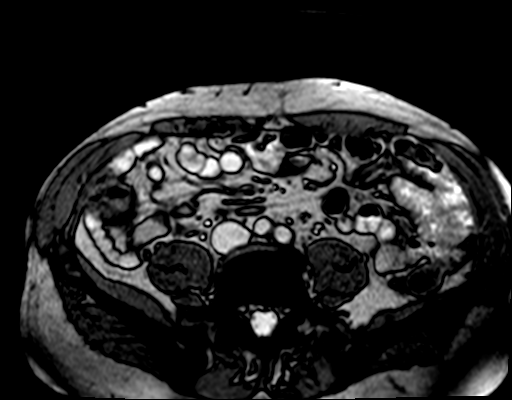
[im 44/44]
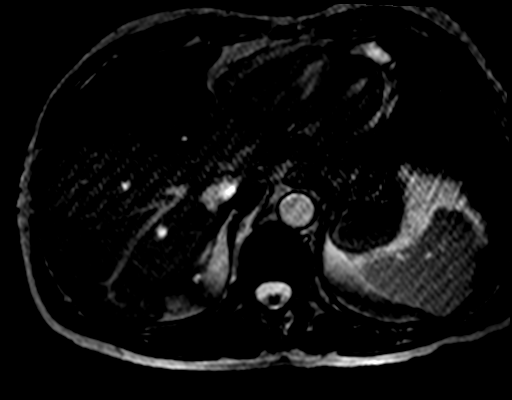

[Series 9: T1 dynamic · coronal · 2.5mm · 0.74mm/px · 2 of 52 slices shown (1 of 4)]
[im 1/52]
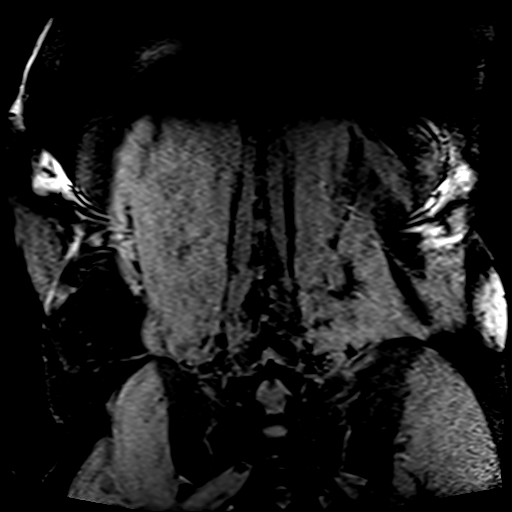
[im 52/52]
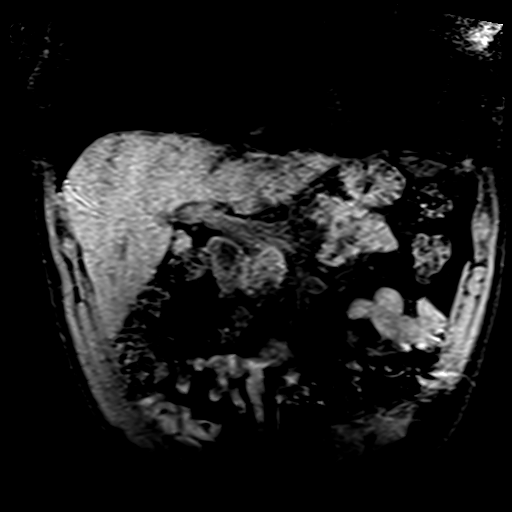

[Series 10: T1 · axial · 5.0mm · 0.68mm/px · z∈[-69,+83]mm · 2 of 54 slices shown]
[im 1/54]
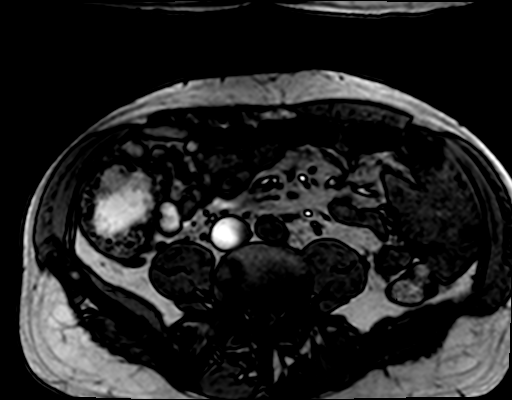
[im 54/54]
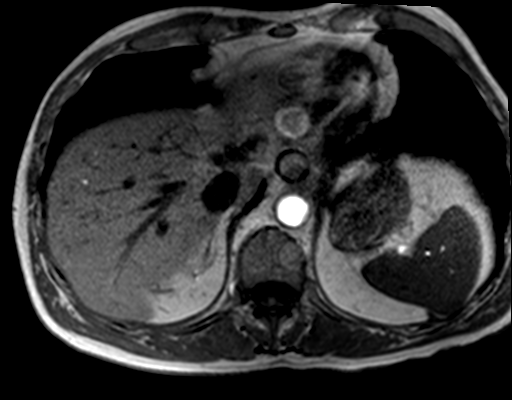

[Series 11: T1 dynamic · axial · non-contrast · 2.3mm · 1.37mm/px · z∈[-73,+87]mm · 3 of 72 slices shown (2 of 4)]
[im 1/72]
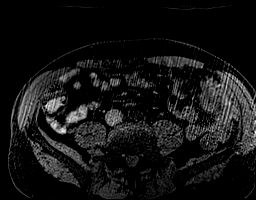
[im 36/72]
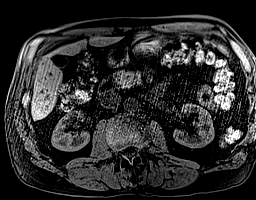
[im 72/72]
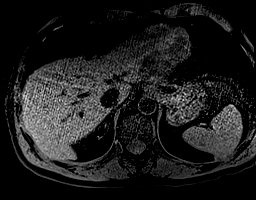

[Series 12: T1 dynamic · axial · non-contrast · 2.3mm · 1.37mm/px · z∈[-77,+83]mm · 3 of 72 slices shown (3 of 4)]
[im 1/72]
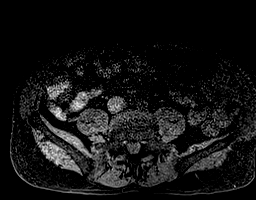
[im 36/72]
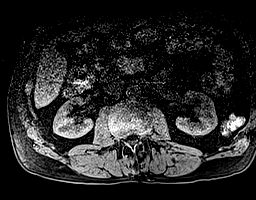
[im 72/72]
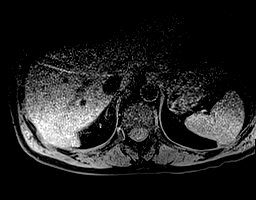

[Series 13: T1 dynamic · axial · non-contrast · 2.3mm · 1.37mm/px · z∈[-77,+83]mm · 3 of 72 slices shown (4 of 4)]
[im 1/72]
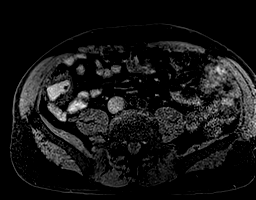
[im 36/72]
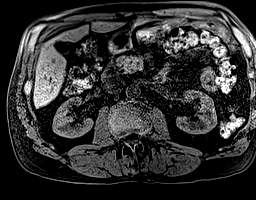
[im 72/72]
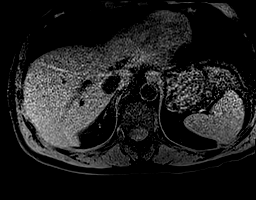

[Series 14: post 25 sec · axial · 2.3mm · 1.37mm/px · z∈[-77,+83]mm · 3 of 72 slices shown]
[im 1/72]
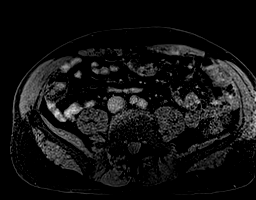
[im 36/72]
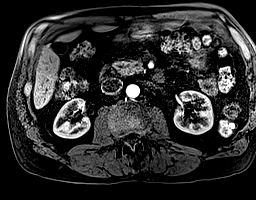
[im 72/72]
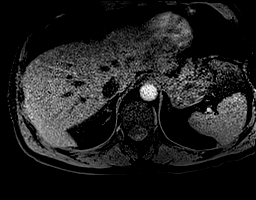

[Series 15: post 25 sec_sub · axial · 2.3mm · 1.37mm/px · z∈[-77,+83]mm · 3 of 72 slices shown]
[im 1/72]
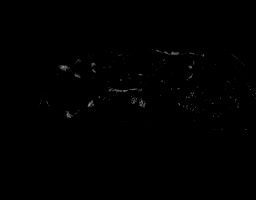
[im 36/72]
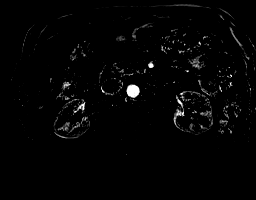
[im 72/72]
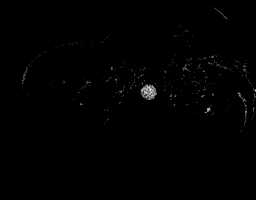

[Series 16: post 45 sec · axial · 2.3mm · 1.37mm/px · z∈[-77,+83]mm · 3 of 72 slices shown]
[im 1/72]
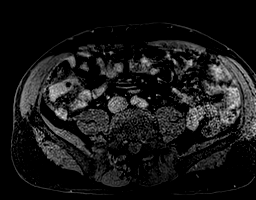
[im 36/72]
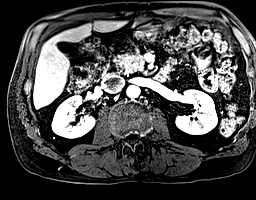
[im 72/72]
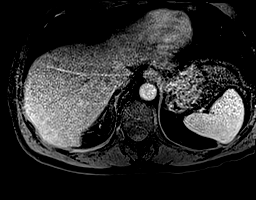

[Series 17: post 45 sec_sub · axial · 2.3mm · 1.37mm/px · z∈[-77,+83]mm · 3 of 72 slices shown]
[im 1/72]
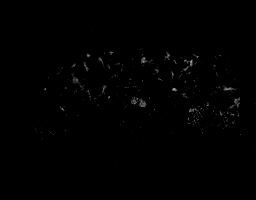
[im 36/72]
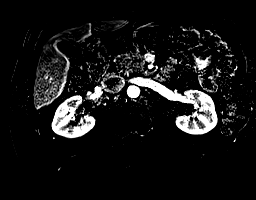
[im 72/72]
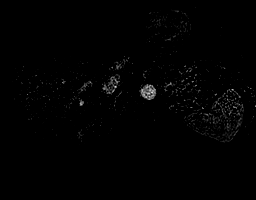

[Series 18: post 90 sec · axial · 2.3mm · 1.37mm/px · z∈[-77,+83]mm · 3 of 72 slices shown]
[im 1/72]
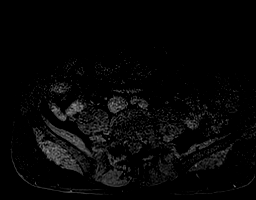
[im 36/72]
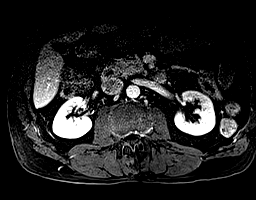
[im 72/72]
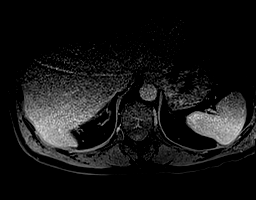

[Series 19: post 90 sec_sub · axial · 2.3mm · 1.37mm/px · z∈[-77,+83]mm · 3 of 72 slices shown]
[im 1/72]
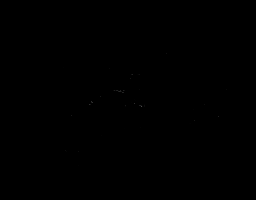
[im 36/72]
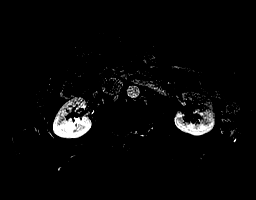
[im 72/72]
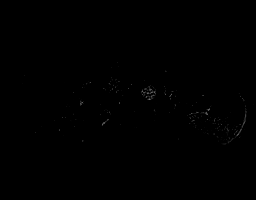

[Series 20: T1 dynamic post-contrast · coronal · 2.5mm · 0.74mm/px · 2 of 52 slices shown]
[im 1/52]
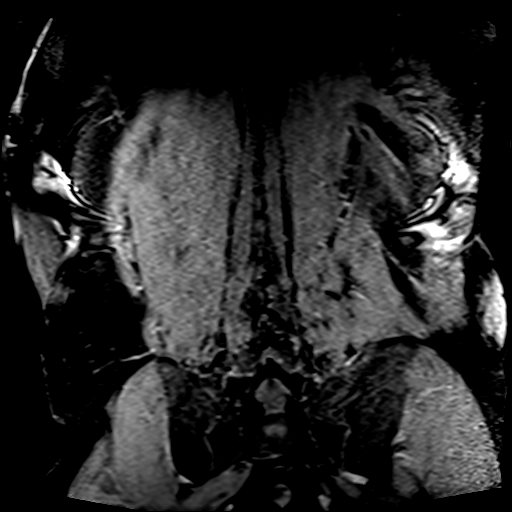
[im 52/52]
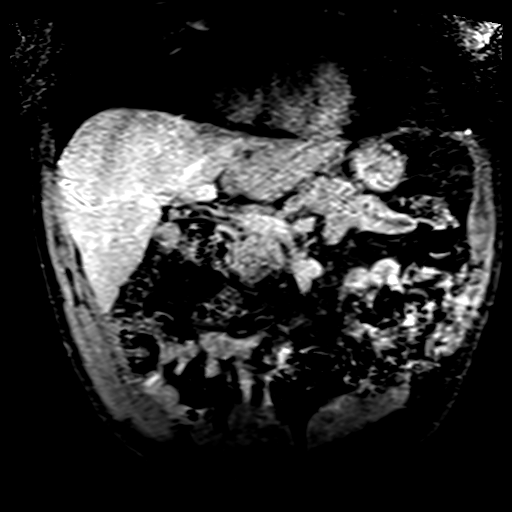

[Series 21: post axial 3+ · axial · 2.3mm · 1.37mm/px · z∈[-77,+83]mm · 3 of 72 slices shown (1 of 2)]
[im 1/72]
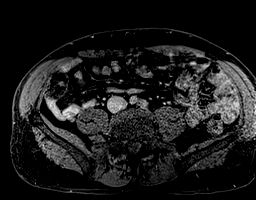
[im 36/72]
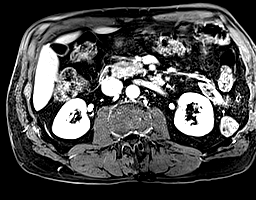
[im 72/72]
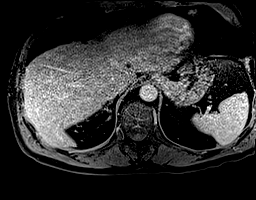

[Series 22: post axial 3+ · axial · 2.3mm · 1.37mm/px · z∈[-77,+83]mm · 3 of 72 slices shown (2 of 2)]
[im 1/72]
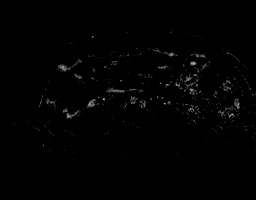
[im 36/72]
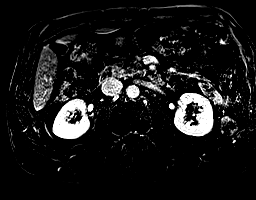
[im 72/72]
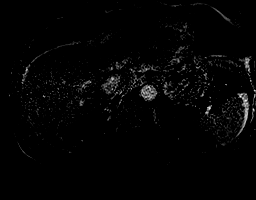

[48 of 48 positions shown; findings below may reference images not displayed]

FINDINGS: Lower chest: No acute abnormality.

Hepatobiliary: No hepatic steatosis. No solid enhancing hepatic
lesion in the visualized portions of the liver. Gallbladder is
unremarkable. No biliary ductal dilation.

Pancreas: Intrinsic T1 signal of the pancreatic parenchyma is within
normal limits. No pancreatic ductal dilation. No cystic or solid
enhancing pancreatic lesion visualized.

Spleen:  Within normal limits.

Adrenals/Urinary Tract:  Bilateral adrenal glands are unremarkable.

No hydronephrosis.

Complex right interpolar renal cyst measuring 4.4 by 3.0 cm on image
[DATE], which demonstrates a solid nodular 2.6 cm enhancing component
on image 19/14 and corresponds with the lesion seen on prior CT
chest. Additional thin walled simple appearing 1.4 cm right upper
pole renal cyst measuring 14 mm on image [DATE].

Left kidney is unremarkable.

Stomach/Bowel: Visualized portions within the abdomen are
unremarkable.

Vascular/Lymphatic: No evidence of tumor in vein. No abdominal
aortic aneurysm. No pathologically enlarged abdominal lymph nodes.

Other:  No abdominal ascites.

Musculoskeletal: No suspicious bone lesions identified.
IMPRESSION: 1. Complex right interpolar renal cyst measuring 4.4 by 3.0 cm,
which demonstrates a solid nodular 2.6 cm enhancing component,
consistent with a Bosniak classification 4 cystic renal cell
carcinoma. Urology consult suggested.
2. No evidence of tumor in renal vein or metastatic disease in the
abdomen.

These results will be called to the ordering clinician or
representative by the Radiologist Assistant, and communication
documented in the PACS or [REDACTED].

## 2022-01-23 ENCOUNTER — Other Ambulatory Visit (HOSPITAL_COMMUNITY): Payer: Self-pay

## 2022-02-14 ENCOUNTER — Other Ambulatory Visit (HOSPITAL_COMMUNITY): Payer: Self-pay

## 2022-02-15 ENCOUNTER — Other Ambulatory Visit (HOSPITAL_COMMUNITY): Payer: Self-pay

## 2022-03-21 ENCOUNTER — Other Ambulatory Visit (HOSPITAL_COMMUNITY): Payer: Self-pay

## 2022-04-18 ENCOUNTER — Other Ambulatory Visit (HOSPITAL_COMMUNITY): Payer: Self-pay

## 2022-04-22 ENCOUNTER — Telehealth: Payer: Self-pay | Admitting: Hematology and Oncology

## 2022-04-22 NOTE — Telephone Encounter (Signed)
Patient left voicemail to reschedule upcoming appointment , reached out to patient to reschedule left voicemail.

## 2022-04-25 ENCOUNTER — Inpatient Hospital Stay: Payer: BC Managed Care – PPO | Admitting: Hematology and Oncology

## 2022-05-08 NOTE — Progress Notes (Signed)
HEMATOLOGY-ONCOLOGY TELEPHONE VISIT PROGRESS NOTE  I connected with our patient on 05/09/22 at  3:30 PM EDT by telephone and verified that I am speaking with the correct person using two identifiers.  I discussed the limitations, risks, security and privacy concerns of performing an evaluation and management service by telephone and the availability of in person appointments.  I also discussed with the patient that there may be a patient responsible charge related to this service. The patient expressed understanding and agreed to proceed.   History of Present Illness: Chris Howard is a 67 y.o. male with history of chronic pulmonary embolism. He presents to the clinic for a telephone follow-up.  He has been tolerating Eliquis extremely well without any problems or concerns.  Does not have any bruising or bleeding issues.  His neck symptoms have resolved with improvement in posture.    REVIEW OF SYSTEMS:   Constitutional: Denies fevers, chills or abnormal weight loss All other systems were reviewed with the patient and are negative. Observations/Objective:     Assessment Plan:  Chronic pulmonary embolism (HCC) CT angiogram 08/25/2020: Chronic right pulmonary artery PE with recanalization, no heart strain possible right lung infarct right upper lobe branches, unusual appearance of soft tissue attenuation in the inferior right hilum (felt to be segmental and subsegmental PE with recannulization), right hilar lymph node 2 cm enhancement behind the 3.4 cm cystic lesion   Lab review: 09/12/2020: Lupus anticoagulant positive (repeat testing 12/18/2020: Persistent positivity for lupus anticoagulant) Factor V Leiden and prothrombin gene mutations: Negative Protein S: 95%, protein C: 114%, anticardiolipin antibodies: Negative, Antithrombin III: 105% ------------------------------------------------------------------------------------------------------------------------------------------------------  Current  treatment: Xarelto, switched to Eliquis on 04/23/2021 (based on patient preference as well as some data showing lower incidence of bleeding episodes Recommendation is lifelong anticoagulation Neck pain resolved with improving his posture  Clear cell carcinoma of right kidney (HCC) 02/07/2021: Right partial nephrectomy: Dr. Mena Goes: 02/07/2021: Clear-cell renal cell cancer with cystic changes nuclear grade 2, 3 cm, limited to the kidney T1a, margins negative No further role of adjuvant treatment.  Follow-up in 1 year  I discussed the assessment and treatment plan with the patient. The patient was provided an opportunity to ask questions and all were answered. The patient agreed with the plan and demonstrated an understanding of the instructions. The patient was advised to call back or seek an in-person evaluation if the symptoms worsen or if the condition fails to improve as anticipated.   I provided 12 minutes of non-face-to-face time during this encounter.  This includes time for charting and coordination of care   Tamsen Meek, MD  I Janan Ridge am acting as a scribe for Dr.Vinay Gudena  I have reviewed the above documentation for accuracy and completeness, and I agree with the above.

## 2022-05-09 ENCOUNTER — Inpatient Hospital Stay: Payer: Medicare Other | Attending: Hematology and Oncology | Admitting: Hematology and Oncology

## 2022-05-09 ENCOUNTER — Other Ambulatory Visit (HOSPITAL_COMMUNITY): Payer: Self-pay

## 2022-05-09 DIAGNOSIS — I2782 Chronic pulmonary embolism: Secondary | ICD-10-CM | POA: Diagnosis not present

## 2022-05-09 MED ORDER — APIXABAN 5 MG PO TABS
5.0000 mg | ORAL_TABLET | Freq: Two times a day (BID) | ORAL | 3 refills | Status: DC
  Filled 2022-05-09 – 2022-05-16 (×2): qty 180, 90d supply, fill #0
  Filled 2022-08-23: qty 180, 90d supply, fill #1
  Filled 2022-11-20 – 2023-01-11 (×2): qty 180, 90d supply, fill #2
  Filled 2023-04-08: qty 180, 90d supply, fill #3

## 2022-05-09 NOTE — Assessment & Plan Note (Signed)
CT angiogram 08/25/2020: Chronic right pulmonary artery PE with recanalization, no heart strain possible right lung infarct right upper lobe branches, unusual appearance of soft tissue attenuation in the inferior right hilum (felt to be segmental and subsegmental PE with recannulization), right hilar lymph node 2 cm enhancement behind the 3.4 cm cystic lesion   Lab review: 09/12/2020: Lupus anticoagulant positive (repeat testing 12/18/2020: Persistent positivity for lupus anticoagulant) Factor V Leiden and prothrombin gene mutations: Negative Protein S: 95%, protein C: 114%, anticardiolipin antibodies: Negative, Antithrombin III: 105% ------------------------------------------------------------------------------------------------------------------------------------------------------  Current treatment: Xarelto, switched to Eliquis on 04/23/2021 (based on patient preference as well as some data showing lower incidence of bleeding episodes Recommendation is lifelong anticoagulation  Clear cell carcinoma of right kidney ([facility]) 02/07/2021: Right partial nephrectomy: Dr. Mena Goes: 02/07/2021: Clear-cell renal cell cancer with cystic changes nuclear grade 2, 3 cm, limited to the kidney T1a, margins negative No further role of adjuvant treatment.  Follow-up in 1 year

## 2022-05-11 ENCOUNTER — Other Ambulatory Visit (HOSPITAL_COMMUNITY): Payer: Self-pay

## 2022-05-16 ENCOUNTER — Other Ambulatory Visit (HOSPITAL_COMMUNITY): Payer: Self-pay

## 2022-06-18 ENCOUNTER — Other Ambulatory Visit (HOSPITAL_COMMUNITY): Payer: Self-pay

## 2022-08-13 ENCOUNTER — Ambulatory Visit (HOSPITAL_COMMUNITY)
Admission: RE | Admit: 2022-08-13 | Discharge: 2022-08-13 | Disposition: A | Discharge status: Home / Self Care | Payer: Medicare Other | Source: Ambulatory Visit | Attending: Urology | Admitting: Urology

## 2022-08-13 ENCOUNTER — Other Ambulatory Visit (HOSPITAL_COMMUNITY): Payer: Self-pay | Admitting: Urology

## 2022-08-13 DIAGNOSIS — C641 Malignant neoplasm of right kidney, except renal pelvis: Secondary | ICD-10-CM

## 2022-08-23 ENCOUNTER — Other Ambulatory Visit: Payer: Self-pay

## 2022-08-23 ENCOUNTER — Other Ambulatory Visit (HOSPITAL_COMMUNITY): Payer: Self-pay

## 2022-08-24 ENCOUNTER — Other Ambulatory Visit (HOSPITAL_COMMUNITY): Payer: Self-pay

## 2022-11-20 ENCOUNTER — Other Ambulatory Visit (HOSPITAL_COMMUNITY): Payer: Self-pay

## 2022-11-29 ENCOUNTER — Other Ambulatory Visit (HOSPITAL_COMMUNITY): Payer: Self-pay

## 2022-12-23 ENCOUNTER — Inpatient Hospital Stay: Payer: Medicare Other | Attending: Hematology and Oncology | Admitting: Hematology and Oncology

## 2022-12-23 DIAGNOSIS — C641 Malignant neoplasm of right kidney, except renal pelvis: Secondary | ICD-10-CM | POA: Diagnosis not present

## 2022-12-23 DIAGNOSIS — I2782 Chronic pulmonary embolism: Secondary | ICD-10-CM

## 2022-12-23 NOTE — Assessment & Plan Note (Signed)
CT angiogram 08/25/2020: Chronic right pulmonary artery PE with recanalization, no heart strain possible right lung infarct right upper lobe branches, unusual appearance of soft tissue attenuation in the inferior right hilum (felt to be segmental and subsegmental PE with recannulization), right hilar lymph node 2 cm enhancement behind the 3.4 cm cystic lesion   Lab review: 09/12/2020: Lupus anticoagulant positive (repeat testing 12/18/2020: Persistent positivity for lupus anticoagulant) Factor V Leiden and prothrombin gene mutations: Negative Protein S: 95%, protein C: 114%, anticardiolipin antibodies: Negative, Antithrombin III: 105% ------------------------------------------------------------------------------------------------------------------------------------------------------  Current treatment: Xarelto, switched to Eliquis on 04/23/2021 (based on patient preference as well as some data showing lower incidence of bleeding episodes Recommendation is lifelong anticoagulation Tolerating the treatment extremely well.

## 2022-12-23 NOTE — Assessment & Plan Note (Signed)
02/07/2021: Right partial nephrectomy: Dr. Mena Goes: 02/07/2021: Clear-cell renal cell cancer with cystic changes nuclear grade 2, 3 cm, limited to the kidney T1a, margins negative No further role of adjuvant treatment.   Follow-up in 1 year

## 2022-12-23 NOTE — Progress Notes (Signed)
HEMATOLOGY-ONCOLOGY TELEPHONE VISIT PROGRESS NOTE  I connected with our patient on 12/23/22 at 11:30 AM EST by telephone and verified that I am speaking with the correct person using two identifiers.  I discussed the limitations, risks, security and privacy concerns of performing an evaluation and management service by telephone and the availability of in person appointments.  I also discussed with the patient that there may be a patient responsible charge related to this service. The patient expressed understanding and agreed to proceed.   History of Present Illness: Follow-up of lupus anticoagulant on anticoagulation  History of Present Illness   The patient, with a history of a positive lupus anticoagulant and a past renal cell cancer, presents for a routine follow-up. He reports no issues with his current medication, a blood thinner, which he has been taking without any complications. He denies any other medication use. He continues to follow-up with his urologist for routine check-ins regarding a past kidney cancer. The patient is otherwise well, enjoying time with family and maintaining a busy work schedule.      REVIEW OF SYSTEMS:   Constitutional: Denies fevers, chills or abnormal weight loss All other systems were reviewed with the patient and are negative. Observations/Objective:     Assessment Plan:  Chronic pulmonary embolism (HCC) CT angiogram 08/25/2020: Chronic right pulmonary artery PE with recanalization, no heart strain possible right lung infarct right upper lobe branches, unusual appearance of soft tissue attenuation in the inferior right hilum (felt to be segmental and subsegmental PE with recannulization), right hilar lymph node 2 cm enhancement behind the 3.4 cm cystic lesion   Lab review: 09/12/2020: Lupus anticoagulant positive (repeat testing 12/18/2020: Persistent positivity for lupus anticoagulant) Factor V Leiden and prothrombin gene mutations: Negative Protein S:  95%, protein C: 114%, anticardiolipin antibodies: Negative, Antithrombin III: 105% ------------------------------------------------------------------------------------------------------------------------------------------------------  Current treatment: Xarelto, switched to Eliquis on 04/23/2021 (based on patient preference as well as some data showing lower incidence of bleeding episodes Recommendation is lifelong anticoagulation Tolerating the treatment extremely well.  Clear cell carcinoma of right kidney (HCC) 02/07/2021: Right partial nephrectomy: Dr. Mena Goes: 02/07/2021: Clear-cell renal cell cancer with cystic changes nuclear grade 2, 3 cm, limited to the kidney T1a, margins negative No further role of adjuvant treatment.   Follow-up in 1 year   I discussed the assessment and treatment plan with the patient. The patient was provided an opportunity to ask questions and all were answered. The patient agreed with the plan and demonstrated an understanding of the instructions. The patient was advised to call back or seek an in-person evaluation if the symptoms worsen or if the condition fails to improve as anticipated.   I provided 12 minutes of non-face-to-face time during this encounter.  This includes time for charting and coordination of care   Tamsen Meek, MD

## 2023-01-11 ENCOUNTER — Other Ambulatory Visit (HOSPITAL_COMMUNITY): Payer: Self-pay

## 2023-04-08 ENCOUNTER — Other Ambulatory Visit (HOSPITAL_COMMUNITY): Payer: Self-pay

## 2023-08-11 ENCOUNTER — Ambulatory Visit (HOSPITAL_COMMUNITY)
Admission: RE | Admit: 2023-08-11 | Discharge: 2023-08-11 | Disposition: A | Discharge status: Home / Self Care | Source: Ambulatory Visit | Attending: Urology | Admitting: Urology

## 2023-08-11 ENCOUNTER — Other Ambulatory Visit (HOSPITAL_COMMUNITY): Payer: Self-pay | Admitting: Urology

## 2023-08-11 DIAGNOSIS — C641 Malignant neoplasm of right kidney, except renal pelvis: Secondary | ICD-10-CM | POA: Insufficient documentation

## 2024-01-16 ENCOUNTER — Other Ambulatory Visit: Payer: Self-pay

## 2024-01-16 ENCOUNTER — Other Ambulatory Visit: Payer: Self-pay | Admitting: Hematology and Oncology

## 2024-01-16 ENCOUNTER — Other Ambulatory Visit (HOSPITAL_COMMUNITY): Payer: Self-pay

## 2024-01-16 MED ORDER — APIXABAN 5 MG PO TABS
5.0000 mg | ORAL_TABLET | Freq: Two times a day (BID) | ORAL | 3 refills | Status: AC
  Filled 2024-01-16: qty 180, 90d supply, fill #0
